# Patient Record
Sex: Female | Born: 1935 | Race: White | Hispanic: No | Marital: Single | Smoking: Never smoker
Health system: Southern US, Community
[De-identification: ages and names within clinical notes are randomized; demographics above are authoritative.]

## PROBLEM LIST (undated history)

## (undated) DIAGNOSIS — K623 Rectal prolapse: Secondary | ICD-10-CM

## (undated) DIAGNOSIS — L219 Seborrheic dermatitis, unspecified: Secondary | ICD-10-CM

## (undated) DIAGNOSIS — R079 Chest pain, unspecified: Secondary | ICD-10-CM

## (undated) DIAGNOSIS — S72009A Fracture of unspecified part of neck of unspecified femur, initial encounter for closed fracture: Secondary | ICD-10-CM

## (undated) DIAGNOSIS — R05 Cough: Secondary | ICD-10-CM

## (undated) DIAGNOSIS — M436 Torticollis: Secondary | ICD-10-CM

## (undated) DIAGNOSIS — M4120 Other idiopathic scoliosis, site unspecified: Secondary | ICD-10-CM

## (undated) DIAGNOSIS — M810 Age-related osteoporosis without current pathological fracture: Secondary | ICD-10-CM

## (undated) DIAGNOSIS — M545 Low back pain: Secondary | ICD-10-CM

## (undated) DIAGNOSIS — M79609 Pain in unspecified limb: Secondary | ICD-10-CM

## (undated) DIAGNOSIS — K648 Other hemorrhoids: Secondary | ICD-10-CM

## (undated) DIAGNOSIS — R609 Edema, unspecified: Secondary | ICD-10-CM

## (undated) DIAGNOSIS — M1991 Primary osteoarthritis, unspecified site: Secondary | ICD-10-CM

## (undated) DIAGNOSIS — M954 Acquired deformity of chest and rib: Secondary | ICD-10-CM

## (undated) DIAGNOSIS — R32 Unspecified urinary incontinence: Secondary | ICD-10-CM

## (undated) HISTORY — DX: Primary osteoarthritis, unspecified site: M19.91

## (undated) HISTORY — PX: APPENDECTOMY: SHX54

## (undated) HISTORY — DX: Age-related osteoporosis without current pathological fracture: M81.0

## (undated) HISTORY — DX: Pain in unspecified limb: M79.609

## (undated) HISTORY — DX: Chest pain, unspecified: R07.9

## (undated) HISTORY — DX: Torticollis: M43.6

## (undated) HISTORY — DX: Unspecified urinary incontinence: R32

## (undated) HISTORY — PX: CATARACT EXTRACTION: SUR2

## (undated) HISTORY — DX: Cough: R05

## (undated) HISTORY — DX: Low back pain: M54.5

## (undated) HISTORY — DX: Rectal prolapse: K62.3

## (undated) HISTORY — DX: Fracture of unspecified part of neck of unspecified femur, initial encounter for closed fracture: S72.009A

## (undated) HISTORY — DX: Other hemorrhoids: K64.8

## (undated) HISTORY — DX: Acquired deformity of chest and rib: M95.4

## (undated) HISTORY — PX: HEMORRHOID SURGERY: SHX153

## (undated) HISTORY — DX: Other idiopathic scoliosis, site unspecified: M41.20

## (undated) HISTORY — DX: Edema, unspecified: R60.9

## (undated) HISTORY — DX: Seborrheic dermatitis, unspecified: L21.9

---

## 1998-07-04 ENCOUNTER — Encounter: Payer: Self-pay | Admitting: Neurosurgery

## 1998-07-04 ENCOUNTER — Ambulatory Visit (HOSPITAL_COMMUNITY): Admission: RE | Admit: 1998-07-04 | Discharge: 1998-07-04 | Payer: Self-pay | Admitting: Neurosurgery

## 1998-08-24 DIAGNOSIS — R059 Cough, unspecified: Secondary | ICD-10-CM

## 1998-08-24 HISTORY — DX: Cough, unspecified: R05.9

## 1998-10-15 HISTORY — PX: COLONOSCOPY: SHX174

## 1999-02-13 DIAGNOSIS — K648 Other hemorrhoids: Secondary | ICD-10-CM

## 1999-02-13 HISTORY — DX: Other hemorrhoids: K64.8

## 2000-02-28 DIAGNOSIS — R32 Unspecified urinary incontinence: Secondary | ICD-10-CM

## 2000-02-28 HISTORY — DX: Unspecified urinary incontinence: R32

## 2000-04-03 ENCOUNTER — Ambulatory Visit (HOSPITAL_COMMUNITY): Admission: RE | Admit: 2000-04-03 | Discharge: 2000-04-03 | Payer: Self-pay | Admitting: *Deleted

## 2001-12-02 DIAGNOSIS — R079 Chest pain, unspecified: Secondary | ICD-10-CM

## 2001-12-02 DIAGNOSIS — M79609 Pain in unspecified limb: Secondary | ICD-10-CM

## 2001-12-02 HISTORY — DX: Chest pain, unspecified: R07.9

## 2001-12-02 HISTORY — DX: Pain in unspecified limb: M79.609

## 2003-08-20 ENCOUNTER — Observation Stay (HOSPITAL_COMMUNITY): Admission: RE | Admit: 2003-08-20 | Discharge: 2003-08-21 | Payer: Self-pay | Admitting: General Surgery

## 2004-10-04 DIAGNOSIS — L219 Seborrheic dermatitis, unspecified: Secondary | ICD-10-CM

## 2004-10-04 DIAGNOSIS — M436 Torticollis: Secondary | ICD-10-CM | POA: Insufficient documentation

## 2004-10-04 HISTORY — DX: Torticollis: M43.6

## 2004-10-04 HISTORY — DX: Seborrheic dermatitis, unspecified: L21.9

## 2005-09-12 ENCOUNTER — Encounter: Admission: RE | Admit: 2005-09-12 | Discharge: 2005-09-12 | Payer: Self-pay | Admitting: Internal Medicine

## 2005-12-13 HISTORY — PX: BOTOX INJECTION: SHX5754

## 2006-03-16 ENCOUNTER — Inpatient Hospital Stay (HOSPITAL_COMMUNITY): Admission: EM | Admit: 2006-03-16 | Discharge: 2006-03-18 | Payer: Self-pay | Admitting: *Deleted

## 2006-03-18 ENCOUNTER — Ambulatory Visit: Payer: Self-pay | Admitting: Physical Medicine & Rehabilitation

## 2006-03-19 DIAGNOSIS — S72009A Fracture of unspecified part of neck of unspecified femur, initial encounter for closed fracture: Secondary | ICD-10-CM

## 2006-03-19 HISTORY — DX: Fracture of unspecified part of neck of unspecified femur, initial encounter for closed fracture: S72.009A

## 2006-03-25 DIAGNOSIS — R609 Edema, unspecified: Secondary | ICD-10-CM

## 2006-03-25 DIAGNOSIS — M810 Age-related osteoporosis without current pathological fracture: Secondary | ICD-10-CM

## 2006-03-25 HISTORY — DX: Age-related osteoporosis without current pathological fracture: M81.0

## 2006-03-25 HISTORY — DX: Edema, unspecified: R60.9

## 2006-03-29 ENCOUNTER — Ambulatory Visit (HOSPITAL_COMMUNITY): Admission: RE | Admit: 2006-03-29 | Discharge: 2006-03-29 | Payer: Self-pay | Admitting: Family Medicine

## 2006-05-14 DIAGNOSIS — M4120 Other idiopathic scoliosis, site unspecified: Secondary | ICD-10-CM

## 2006-05-14 HISTORY — DX: Other idiopathic scoliosis, site unspecified: M41.20

## 2006-06-05 DIAGNOSIS — M1991 Primary osteoarthritis, unspecified site: Secondary | ICD-10-CM

## 2006-06-05 HISTORY — DX: Primary osteoarthritis, unspecified site: M19.91

## 2006-07-10 DIAGNOSIS — K623 Rectal prolapse: Secondary | ICD-10-CM

## 2006-07-10 HISTORY — DX: Rectal prolapse: K62.3

## 2007-02-19 DIAGNOSIS — M545 Low back pain, unspecified: Secondary | ICD-10-CM

## 2007-02-19 HISTORY — DX: Low back pain, unspecified: M54.50

## 2007-05-28 DIAGNOSIS — M954 Acquired deformity of chest and rib: Secondary | ICD-10-CM

## 2007-05-28 HISTORY — DX: Acquired deformity of chest and rib: M95.4

## 2011-12-26 ENCOUNTER — Ambulatory Visit
Admission: RE | Admit: 2011-12-26 | Discharge: 2011-12-26 | Disposition: A | Discharge status: Home / Self Care | Payer: Medicare Other | Source: Ambulatory Visit | Attending: Internal Medicine | Admitting: Internal Medicine

## 2011-12-26 ENCOUNTER — Other Ambulatory Visit: Payer: Self-pay | Admitting: Geriatric Medicine

## 2011-12-26 DIAGNOSIS — R05 Cough: Secondary | ICD-10-CM

## 2012-08-21 ENCOUNTER — Ambulatory Visit
Admission: RE | Admit: 2012-08-21 | Discharge: 2012-08-21 | Disposition: A | Discharge status: Home / Self Care | Payer: Medicare Other | Source: Ambulatory Visit | Attending: Geriatric Medicine | Admitting: Geriatric Medicine

## 2012-08-21 ENCOUNTER — Other Ambulatory Visit: Payer: Self-pay | Admitting: Geriatric Medicine

## 2012-08-21 DIAGNOSIS — M549 Dorsalgia, unspecified: Secondary | ICD-10-CM

## 2012-08-21 DIAGNOSIS — R05 Cough: Secondary | ICD-10-CM

## 2012-08-21 DIAGNOSIS — R609 Edema, unspecified: Secondary | ICD-10-CM

## 2012-08-21 LAB — BASIC METABOLIC PANEL
BUN: 21 mg/dL (ref 4–21)
Creatinine: 0.6 mg/dL (ref 0.5–1.1)
Glucose: 111 mg/dL
POTASSIUM: 4.2 mmol/L (ref 3.4–5.3)
SODIUM: 141 mmol/L (ref 137–147)

## 2012-08-21 LAB — HEPATIC FUNCTION PANEL
ALK PHOS: 49 U/L (ref 25–125)
ALT: 9 U/L (ref 7–35)
AST: 17 U/L (ref 13–35)
Bilirubin, Total: 0.5 mg/dL

## 2012-08-21 LAB — CBC AND DIFFERENTIAL
HEMATOCRIT: 37 % (ref 36–46)
HEMOGLOBIN: 11.6 g/dL — AB (ref 12.0–16.0)
Platelets: 275 10*3/uL (ref 150–399)
WBC: 4.2 10^3/mL

## 2013-10-23 ENCOUNTER — Other Ambulatory Visit: Payer: Self-pay | Admitting: Geriatric Medicine

## 2013-10-23 ENCOUNTER — Encounter: Payer: Self-pay | Admitting: Geriatric Medicine

## 2013-10-23 ENCOUNTER — Non-Acute Institutional Stay: Payer: Medicare Other | Admitting: Geriatric Medicine

## 2013-10-23 ENCOUNTER — Ambulatory Visit
Admission: RE | Admit: 2013-10-23 | Discharge: 2013-10-23 | Disposition: A | Discharge status: Home / Self Care | Payer: Medicare Other | Source: Ambulatory Visit | Attending: Geriatric Medicine | Admitting: Geriatric Medicine

## 2013-10-23 DIAGNOSIS — R918 Other nonspecific abnormal finding of lung field: Secondary | ICD-10-CM

## 2013-10-23 DIAGNOSIS — M436 Torticollis: Secondary | ICD-10-CM

## 2013-10-23 DIAGNOSIS — R609 Edema, unspecified: Secondary | ICD-10-CM

## 2013-10-23 DIAGNOSIS — R05 Cough: Secondary | ICD-10-CM

## 2013-10-23 DIAGNOSIS — R059 Cough, unspecified: Secondary | ICD-10-CM

## 2013-10-23 DIAGNOSIS — M545 Low back pain, unspecified: Secondary | ICD-10-CM

## 2013-10-23 DIAGNOSIS — Z66 Do not resuscitate: Secondary | ICD-10-CM

## 2013-10-23 DIAGNOSIS — R079 Chest pain, unspecified: Secondary | ICD-10-CM

## 2013-10-23 DIAGNOSIS — R222 Localized swelling, mass and lump, trunk: Secondary | ICD-10-CM

## 2013-10-23 NOTE — Assessment & Plan Note (Signed)
New lung mass right perihilar region; no masses present on previous x-rays 2013. Patient's worsening cough, musculoskeletal pain and x-ray findings are worrisome for carcinoma. Discussed proceeding with CT scan of the chest to better diagnose this problem. Patient is agreeable to this investigation. She does continue to maintain she's not interested in prolonging her life though is interested in finding out why she feels so bad.

## 2013-10-23 NOTE — Progress Notes (Signed)
Patient ID: [name], female   DOB: September 01, 1915, 80 y.o.   MRN: [id number]  [facility] [phone])  Code Status: DNR Contact Information   Name Relation Home Work Mobile   [place] [phone]    Stefanie Libel [phone]         Chief Complaint  Patient presents with  . Cough  . Abnormal CXR    HPI: This is a 80 y.o. female resident of [facility]  section.  Evaluation is requested today due to  worsening cough and new onset of pain. Patient reports she's had increase of pain "all over the last 2-3 days. Chronic cough is worse, and occasionally productive. Her chronic back pain is much worse. She is having difficulty today lifting her arms and is experiencing very bad pain getting in and out of her chair and in and out of bed. She denies any headache sore throat or chest pain or shortness of breath. Tells me her appetite remains good, sleeping well despite getting up every 2 hours to urinate. This patient has been using a power wheelchair for several years for any distance mobility. She continues to travel from her apartment to the dining room daily for lunch meal, remains independent in her activities of daily living.    No Known Allergies  MEDICATIONS -  no prescribed medications prior to this visit. Does use cough drops multiple times during the day   Medication List       This list is accurate as of: 10/23/13  2:00 PM.  Always use your most recent med list.               chlorpheniramine-HYDROcodone 10-8 MG/5ML Lqcr  Commonly known as:  TUSSIONEX  Take 2.5 mLs by mouth 2 (two) times daily.        DATA REVIEWED  Radiologic Exams 10/23/2013  CHEST 2 VIEW  COMPARISON: 08/21/2012.  IMPRESSION:  New nodular opacity in the right perihilar region, worrisome for primary bronchogenic carcinoma. CT chest with contrast could be performed in further evaluation, as clinically  indicated.  Cardiovascular Exams:   Laboratory Studies Lab Results- [facility] 08/21/2012  Component Value   WBC 4.2   HGB 11.6*   HCT 37   PLT 275       Glucose 111   ALT 9   AST 17   NA 141   K 4.2   CREATININE 0.6   BUN 21   Albumin 3.6       BNP 123.5    REVIEW OF SYSTEMS  DATA OBTAINED: from patient GENERAL: Feels unwell due to pain  No fevers, fatigue, change in appetite or weight SKIN: No itch, rash or open wounds EYES: No eye pain, dryness or itching  No change in vision EARS: No earache, tinnitus, change in hearing NOSE: No congestion, drainage or bleeding MOUTH/THROAT: No mouth or tooth pain    No sore throat    No difficulty chewing or swallowing (cannot swallow pills, not new) RESPIRATORY: Cough, worse than usual No wheezing, SOB CARDIAC: No chest pain, palpitations  No edema. CHEST/BREASTS: Right-sided anterior chest discomfort  GI: No abdominal pain  No N/V/D or constipation  No heartburn or reflux  GU: No dysuria, frequency or urgency  No change in urine volume or character  Nocturia q2hr (not new)  MUSCULOSKELETAL: Generalized skeletal pain, chronic back pain is worse unable to tip head up to 2 neck pain and stiffness , unable to raise arms  past a certain point. Gait is unsteady, chronic, is able to ambulate short distances No recent falls.  NEUROLOGIC: No dizziness, fainting, headache,  No change in mental status.  PSYCHIATRIC: No feelings of anxiety, depression   Sleeps well.  Marland Kitchen.    PHYSICAL EXAM Filed Vitals:   10/23/13 1348  BP: 130/70  Pulse: 104  Temp: 99.8 F (37.7 C)  SpO2: 92%   There is no height or weight on file to calculate BMI.  GENERAL APPEARANCE: No acute distress, appropriately groomed, Frail, very thin body habitus. Alert, pleasant, conversant. SKIN: No diaphoresis, rash, unusual lesions, wounds HEAD: Normocephalic, atraumatic EYES: Conjunctiva/lids clear. Pupils round, reactive.  EARS: External exam WNL  Hearing grossly  normal. NOSE: No deformity or discharge. MOUTH/THROAT: Lips w/o lesions. Oral mucosa, tongue moist, w/o lesion. Oropharynx w/o redness or lesions.  NECK: Very limited ROM. No thyroid tenderness, enlargement or nodule LYMPHATICS: No head, neck or supraclavicular adenopathy RESPIRATORY: Breathing is even, unlabored. Lung sounds are clear, shallow. Deep breath causes coughing  CHEST/BREASTS: No chest deformity. Anterior right chest tenderness CARDIOVASCULAR: Heart RRR. No murmur or extra heart sounds  VENOUS: No varicosities. No venous stasis skin changes  EDEMA: No peripheral edema.  GASTROINTESTINAL: Abdomen is soft, non-tender, not distended w/ normal bowel sounds. MUSCULOSKELETAL: Bilateral shoulder movement is reduced with active range of motion, limited to about 90. Is able to raise her hands arms over her head with assistance. Neck with mild torticollis to the right. Back with kyphosis, generalized lower back tenderness.  NEUROLOGIC: Oriented to time, place, person. Cranial nerves 2-12 grossly intact, speech clear, no tremor.  PSYCHIATRIC: Mood and affect appropriate to situation  ASSESSMENT/PLAN  Lung mass New lung mass right perihilar region; no masses present on previous x-rays 2013. Patient's worsening cough, musculoskeletal pain and x-ray findings are worrisome for carcinoma. Discussed proceeding with CT scan of the chest to better diagnose this problem. Patient is agreeable to this investigation. She does continue to maintain she's not interested in prolonging her life though is interested in finding out why she feels so bad.   Time: 45 minutes, >50% spent counseling/or care coordination  Follow up: As needed  Alias Villagran T.Jaylie Neaves, NP-C 10/23/2013

## 2013-10-24 ENCOUNTER — Other Ambulatory Visit: Payer: Self-pay | Admitting: Geriatric Medicine

## 2013-10-24 MED ORDER — HYDROCOD POLST-CHLORPHEN POLST 10-8 MG/5ML PO LQCR: 2.5000 mL | Freq: Two times a day (BID) | ORAL | Status: DC

## 2013-10-28 ENCOUNTER — Ambulatory Visit
Admission: RE | Admit: 2013-10-28 | Discharge: 2013-10-28 | Disposition: A | Discharge status: Home / Self Care | Payer: Medicare Other | Source: Ambulatory Visit | Attending: Geriatric Medicine | Admitting: Geriatric Medicine

## 2013-10-28 DIAGNOSIS — R918 Other nonspecific abnormal finding of lung field: Secondary | ICD-10-CM

## 2013-10-29 ENCOUNTER — Other Ambulatory Visit: Payer: Self-pay | Admitting: Geriatric Medicine

## 2013-10-29 NOTE — Addendum Note (Signed)
Addended by: Charna ElizabethWILLIAMS, DEBRA J on: 10/29/2013 10:40 AM   Modules accepted: Orders

## 2013-11-02 ENCOUNTER — Other Ambulatory Visit: Payer: Self-pay | Admitting: Geriatric Medicine

## 2013-11-02 MED ORDER — HYDROCOD POLST-CHLORPHEN POLST 10-8 MG/5ML PO LQCR: 2.5000 mL | Freq: Two times a day (BID) | ORAL | Status: DC

## 2013-11-04 ENCOUNTER — Non-Acute Institutional Stay: Payer: Medicare Other | Admitting: Geriatric Medicine

## 2013-11-04 ENCOUNTER — Encounter: Payer: Self-pay | Admitting: Geriatric Medicine

## 2013-11-04 VITALS — BP 140/68 | HR 73 | Ht 59.5 in | Wt 91.0 lb

## 2013-11-04 DIAGNOSIS — R222 Localized swelling, mass and lump, trunk: Secondary | ICD-10-CM

## 2013-11-04 DIAGNOSIS — R918 Other nonspecific abnormal finding of lung field: Secondary | ICD-10-CM

## 2013-11-04 NOTE — Progress Notes (Signed)
Patient ID: Ewing SchleinMargaret Moran, female   DOB: 1914-11-12, 80 y.o.   MRN: [id number]  [facility] [phone](12)  Code Status: DNR     Contact Information   Name Relation Home Work Mobile   [place] In[place]Berry,M.Jennifer Nephew [phone]    [name],Moran Other [phone]     [name],[name] Other [phone]         Chief Complaint  Patient presents with  . Lung Lesion    right upper lobe, seen on chest x-ray and CT scan. With niece Jennifer Hatchnn today    HPI: This is a 80 y.o. female resident of [facility]  [facility].  She returns to clinic today to discuss results of CT scan of the chest .  Last visit: Lung mass New lung mass right perihilar region; no masses present on previous x-rays 2013. Patient's worsening cough, musculoskeletal pain and x-ray findings are worrisome for carcinoma. Discussed proceeding with CT scan of the chest to better diagnose this problem. Patient is agreeable to this investigation. She does continue to maintain she's not interested in prolonging her life though is interested in finding out why she feels so bad.  Since last visit patient has been taking hydrocodone cough syrup small dose twice a day. This has resulted in less coughing and less pain. Patient reports she still has generalized dull pain especially along her right anterior chest wall, but no longer has sharp pain that is limiting her arm movements. CT of the chest confirmed the right upper lobe lesion, but does not appear to be lymphadenopathy or bony metastasis. Discussed these findings at length today with the patient and her niece [name]. Patient does not desire to investigate this lesion any further, she has no intention of undergoing any treatment. She has demonstrated some physical and mental decline over the last several months, we discussed implications of these changes. This patient has a great desire to remain as independent as possible for  as long as possible. She tells me that she will accept help "when the time comes". Specific recommendations today is for use of a walker during ambulation in her apartment, have asked her wear the emergency pendant or at least on the walker for easy accessibility should she fall.   No Known Allergies     Medication List       This list is accurate as of: 11/04/13  4:39 PM.  Always use your most recent med list.               chlorpheniramine-HYDROcodone 10-8 MG/5ML Lqcr  Commonly known as:  TUSSIONEX  Take 2.5 mLs by mouth 2 (two) times daily.        DATA REVIEWED  Radiologic Exams 10/23/2013  CHEST 2 VIEW  COMPARISON: 08/21/2012.  IMPRESSION:  New nodular opacity in the right perihilar region, worrisome for primary bronchogenic carcinoma. CT chest with contrast could be performed in further evaluation, as clinically indicated.  10/28/2013  CT CHEST WITHOUT CONTRAST   COMPARISON: Chest x-ray 06/02/2014.  IMPRESSION:  1. 24 x 20 mm superior segment right upper lobe pulmonary lesion with adjacent postobstructive atelectasis. This is worrisome for pulmonary neoplasm. Recommend PET-CT for further evaluation. No  obvious mediastinal or hilar lymphadenopathy.  2. Small scattered sub 3 mm pulmonary nodules.  3. No findings for osseous metastatic disease.   Cardiovascular Exams:   Laboratory Studies Lab Results- [facility] 08/21/2012  Component Value   WBC 4.2   HGB 11.6*   HCT 37  PLT 275       Glucose 111   ALT 9   AST 17   NA 141   K 4.2   CREATININE 0.6   BUN 21   Albumin 3.6       BNP 123.5    REVIEW OF SYSTEMS  DATA OBTAINED: from patient GENERAL: Feels better than last visit, tires easily, No fevers, change in appetite or weight NOSE: No congestion, drainage or bleeding MOUTH/THROAT: No mouth or tooth pain    No sore throat    No difficulty chewing or swallowing (cannot swallow pills, not new) RESPIRATORY: Cough, less prominent, nonproductive No wheezing,  SOB CARDIAC: No chest pain, palpitations  No edema. CHEST/BREASTS: Right-sided anterior chest discomfort, dull pain GI: No abdominal pain  No N/V/D or constipation  No heartburn or reflux  GU: No dysuria, frequency or urgency  No change in urine volume or character  Nocturia q2hr (not new)  MUSCULOSKELETAL: Less generalized skeletal pain, chronic back pain is tolerable.  Gait is unsteady, chronic, is able to ambulate short distances No recent falls.  NEUROLOGIC: No dizziness, fainting, headache,  No change in mental status.  PSYCHIATRIC: No feelings of anxiety, depression   Sleeps well.  Marland Kitchen    PHYSICAL EXAM Filed Vitals:   11/04/13 1634  BP: 140/68  Pulse: 73  Height: 4' 11.5" (1.511 m)  Weight: 91 lb (41.277 kg)  SpO2: 94%   Body mass index is 18.08 kg/(m^2).  GENERAL APPEARANCE: No acute distress, appropriately groomed, Frail, very thin body habitus. Alert, pleasant, conversant. SKIN: No diaphoresis, rash, unusual lesions, wounds HEAD: Normocephalic, atraumatic EYES: Conjunctiva/lids clear. Pupils round, reactive.  EARS: External exam WNL  Hearing grossly normal. NOSE: No deformity or discharge. MOUTH/THROAT: Lips w/o lesions. Oral mucosa, tongue moist, w/o lesion. Oropharynx w/o redness or lesions.  NECK: Very limited ROM. No thyroid tenderness, enlargement or nodule LYMPHATICS: No head, neck or supraclavicular adenopathy RESPIRATORY: Breathing is even, unlabored. Lung sounds are clear, shallow. Deep breath causes coughing  CHEST/BREASTS: No chest deformity. Anterior right chest tenderness CARDIOVASCULAR: Heart RRR. No murmur or extra heart sounds  VENOUS: No varicosities. No venous stasis skin changes  EDEMA: No peripheral edema.  GASTROINTESTINAL: Abdomen is soft, non-tender, not distended w/ normal bowel sounds. MUSCULOSKELETAL:  Is able to raise both arms above her head today Neck with mild torticollis to the right. Back with kyphosis, generalized lower back tenderness.   NEUROLOGIC: Oriented to time, place, person. Cranial nerves 2-12 grossly intact, speech clear, no tremor.  PSYCHIATRIC: Mood and affect appropriate to situation  ASSESSMENT/PLAN  Lung mass CT scan confirmed left upper lobe lung mass. Appears peripheral, close to anterior right chest wall. This likely accounts for her chest wall discomfort. Hydrocodone syrup has been effective in managing her main symptoms of cough and pain. Anticipate patient will have a slow decline from this process along with her advancing age. Have discussed some strategies for maintaining safe environment her home, and reminded her that there are resources and support at [facility] available to her as her condition changes.   Time: 45 minutes, >50% spent counseling/or care coordination  Follow up: One month  Jamal Pavon T.Orris Perin, NP-C 11/04/2013

## 2013-11-04 NOTE — Assessment & Plan Note (Signed)
CT scan confirmed left upper lobe lung mass. Appears peripheral, close to anterior right chest wall. This likely accounts for her chest wall discomfort. Hydrocodone syrup has been effective in managing her main symptoms of cough and pain. Anticipate patient will have a slow decline from this process along with her advancing age. Have discussed some strategies for maintaining safe environment her home, and reminded her that there are resources and support at [facility] available to her as her condition changes.

## 2013-11-05 ENCOUNTER — Other Ambulatory Visit: Payer: Medicare Other

## 2013-12-09 ENCOUNTER — Encounter: Payer: Self-pay | Admitting: Geriatric Medicine

## 2013-12-09 ENCOUNTER — Non-Acute Institutional Stay: Payer: Medicare Other | Admitting: Geriatric Medicine

## 2013-12-09 VITALS — BP 148/68 | HR 84 | Ht 59.5 in | Wt 87.0 lb

## 2013-12-09 DIAGNOSIS — R222 Localized swelling, mass and lump, trunk: Secondary | ICD-10-CM

## 2013-12-09 DIAGNOSIS — R918 Other nonspecific abnormal finding of lung field: Secondary | ICD-10-CM

## 2013-12-09 NOTE — Assessment & Plan Note (Signed)
Symptoms of anterior chest pain and cough are improved. Patient declines refill of hydrocodone cough syrup. Tells me she'll call if she needs it. Patient does exhibit significant weight loss in the last month despite maintaining her usual p.o. Intake. Patient continues to be very pragmatic about her situation, notes that "I am 80 years old". She is very safe and comfortable in her home, content with her daily activities, expresses that she is ready to die when it is her time. Encouraged her to call with any changes in her condition including any return of pain, worsening cough, nausea, loss of appetite or fatigue.

## 2013-12-09 NOTE — Progress Notes (Signed)
Patient ID: [name], female   DOB: 1915-09-24, 80 y.o.   MRN: [id number]  [facility] [phone](12)  Code Status: DNR     Contact Information   Name Relation Home Work Mobile   [place],M.Douglas Nephew [phone]    Stefanie LibelSomers,[name] Other [phone]     Lonia FarberBerry,Rosemary Other [phone]         Chief Complaint  Patient presents with  . Medical Managment of Chronic Issues    lung mass    HPI: This is a 80 y.o. female resident of [facility]  [facility].   Last visit:  Lung mass CT scan confirmed left upper lobe lung mass. Appears peripheral, close to anterior right chest wall. This likely accounts for her chest wall discomfort. Hydrocodone syrup has been effective in managing her main symptoms of cough and pain. Anticipate patient will have a slow decline from this process along with her advancing age. Have discussed some strategies for maintaining safe environment her home, and reminded her that there are resources and support at [facility] available to her as her condition changes.  Since last visit, patient reports anterior chest pain has resolved, her cough has improved as well. She is no longer taking hydrocodone cough syrup. Discontinue with her chronic cough she treats with cough drops. 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 Turns off the computer at 5 PM watches the news has a martini and goes to bed. Patient remarks she is quite satisfied with her life though she is ready to go anytime.   No Known Allergies     Medication List    Notice As of 12/09/2013  4:14 PM   You have not been prescribed any medications.      DATA REVIEWED  Radiologic Exams 10/23/2013  CHEST 2 VIEW  COMPARISON: 08/21/2012.  IMPRESSION:  New nodular opacity in the right  perihilar region, worrisome for primary bronchogenic carcinoma. CT chest with contrast could be performed in further evaluation, as clinically indicated.  10/28/2013  CT CHEST WITHOUT CONTRAST   COMPARISON: Chest x-ray 06/02/2014.  IMPRESSION:  1. 24 x 20 mm superior segment right upper lobe pulmonary lesion with adjacent postobstructive atelectasis. This is worrisome for pulmonary neoplasm. Recommend PET-CT for further evaluation. No  obvious mediastinal or hilar lymphadenopathy.  2. Small scattered sub 3 mm pulmonary nodules.  3. No findings for osseous metastatic disease.   Cardiovascular Exams:   Laboratory Studies Lab Results- [facility] 08/21/2012  Component Value   WBC 4.2   HGB 11.6*   HCT 37   PLT 275       Glucose 111   ALT 9   AST 17   NA 141   K 4.2   CREATININE 0.6   BUN 21   Albumin 3.6       BNP 123.5    REVIEW OF SYSTEMS  DATA OBTAINED: from patient GENERAL: Feels better than last visit, has resumed her usual activity, No fevers or change in appetite. Has lost weight NOSE: No congestion, drainage or bleeding MOUTH/THROAT: No mouth or tooth pain    No sore throat    No difficulty chewing or swallowing (cannot swallow pills, not new) RESPIRATORY: Cough, less prominent, nonproductive No wheezing, SOB CARDIAC: No chest pain, palpitations  No edema. GI: No abdominal pain  No N/V/D or constipation  No  heartburn or reflux  GU: No dysuria, frequency or urgency  No change in urine volume or character  Nocturia q2hr (not new)  MUSCULOSKELETAL: Chronic back pain is tolerable, limits activity.  Gait is unsteady, chronic, is able to ambulate short distances  No recent falls.  NEUROLOGIC: No dizziness, fainting, headache,  No change in mental status.  PSYCHIATRIC: No feelings of anxiety, depression   Sleeps well.  Marland Kitchen    PHYSICAL EXAM Filed Vitals:   12/09/13 1553  BP: 148/68  Pulse: 84  Height: 4' 11.5" (1.511 m)  Weight: 87 lb (39.463 kg)   Body mass index is 17.28  kg/(m^2).  GENERAL APPEARANCE: No acute distress, appropriately groomed, Frail, very thin body habitus. 7 lb weight loss in last month. Alert, pleasant, conversant. SKIN: No diaphoresis, rash, HEAD: Normocephalic, atraumatic EYES: Conjunctiva/lids clear. Pupils round, reactive.  EARS: External exam WNL  Hearing grossly normal. NOSE: No deformity or discharge. MOUTH/THROAT: Lips w/o lesions. Oral mucosa, tongue moist, w/o lesion. Oropharynx w/o redness or lesions.  NECK: Very limited ROM. No thyroid tenderness, enlargement or nodule LYMPHATICS: No head, neck or supraclavicular adenopathy RESPIRATORY: Breathing is even, unlabored. Lung sounds are clear, shallow. Able to take deep breath without coughing   CHEST/BREASTS: No chest deformity. No Anterior right chest tenderness CARDIOVASCULAR: Heart RRR. No murmur or extra heart sounds  VENOUS: No varicosities. No venous stasis skin changes  EDEMA: No peripheral edema.  GASTROINTESTINAL: Abdomen is soft, non-tender, not distended w/ normal bowel sounds. MUSCULOSKELETAL:  Is able to raise both arms above her head today Neck with mild torticollis to the right. Back with kyphosis, generalized lower back tenderness.  NEUROLOGIC: Oriented to time, place, person. Cranial nerves 2-12 grossly intact, speech clear, no tremor.  PSYCHIATRIC: Mood and affect appropriate to situation  ASSESSMENT/PLAN  Lung mass Symptoms of anterior chest pain and cough are improved. Patient declines refill of hydrocodone cough syrup. Tells me she'll call if she needs it. Patient does exhibit significant weight loss in the last month despite maintaining her usual p.o. Intake. Patient continues to be very pragmatic about her situation, notes that "I am 80 years old". She is very safe and comfortable in her home, content with her daily activities, expresses that she is ready to die when it is her time. Encouraged her to call with any changes in her condition including any return of  pain, worsening cough, nausea, loss of appetite or fatigue.    Follow up: Return in about 2 months (around 02/06/2014) for Lung  mass, weight loss.  Toribio Harbour, NP-C [facility] [phone]  12/09/2013

## 2014-02-03 ENCOUNTER — Encounter: Payer: Self-pay | Admitting: Geriatric Medicine

## 2014-02-03 ENCOUNTER — Non-Acute Institutional Stay: Payer: Medicare Other | Admitting: Geriatric Medicine

## 2014-02-03 VITALS — BP 126/66 | HR 72 | Wt 87.0 lb

## 2014-02-03 DIAGNOSIS — M545 Low back pain, unspecified: Secondary | ICD-10-CM

## 2014-02-03 DIAGNOSIS — R918 Other nonspecific abnormal finding of lung field: Secondary | ICD-10-CM

## 2014-02-03 DIAGNOSIS — R222 Localized swelling, mass and lump, trunk: Secondary | ICD-10-CM

## 2014-02-03 NOTE — Assessment & Plan Note (Addendum)
Severe back pain does limit mobility though she is able to ambulate short distances without assistive device. Uses power scooter for any mobility outside her apartment.  Reviewed some safety considerations relating to her home. Reminded her that if she feels at all unsteady use of a walker would be safest alternative as opposed to "furniture walking".  Declines use of any medication to reduce pain

## 2014-02-03 NOTE — Assessment & Plan Note (Signed)
No anterior chest pain, productive cough persists. She feels this is managed well enough with Robitussin. No further weight loss in the last 2 months, she continues to have a good appetite. Patient again reminds me that she is nearly 80 years old, intends to stay as independent as possible for as long as possible. Encouraged her to call with any changes in her condition including any return of pain, worsening cough, nausea, loss of appetite or fatigue.

## 2014-02-03 NOTE — Progress Notes (Signed)
Patient ID: Jennifer SchleinMargaret Vankleeck, female   DOB: Oct 06, 1915, 80 y.o.   MRN: [id number]  [facility] [phone](12)  Code Status: DNR     Contact Information   Name Relation Home Work Mobile   LakeportBerry,M.Douglas Nephew [phone]    Stefanie LibelSomers,[name] Other [phone]     Lonia FarberBerry,Rosemary Other [phone]         Chief Complaint  Patient presents with  . Medical Management of Chronic Issues    lung mass    HPI: This is a 80 y.o. female resident of [facility]  section.   Last visit: Lung mass Symptoms of anterior chest pain and cough are improved. Patient declines refill of hydrocodone cough syrup. Tells me she'll call if she needs it. Patient does exhibit significant weight loss in the last month despite maintaining her usual p.o. Intake. Patient continues to be very pragmatic about her situation, notes that "I am 80 years old". She is very safe and comfortable in her home, content with her daily activities, expresses that she is ready to die when it is her time. Encouraged her to call with any changes in her condition including any return of pain, worsening cough, nausea, loss of appetite or fatigue.  Since last visit patient reports feeling "fairly well". Continues with productive cough, uses Robitussin a couple times a day. Denies any chest pain or shortness of breath. Does have significant back and leg pain which limits her mobility. She does ambulate in her apartment without any assistive device. She has not had any falls   No Known Allergies     Medication List    Notice As of 02/03/2014  1:46 PM   You have not been prescribed any medications.      DATA REVIEWED  Radiologic Exams 10/23/2013  CHEST 2 VIEW  COMPARISON: 08/21/2012.  IMPRESSION:  New nodular opacity in the right perihilar region, worrisome for primary bronchogenic carcinoma. CT chest with contrast could be performed in further evaluation, as  clinically indicated.  10/28/2013  CT CHEST WITHOUT CONTRAST   COMPARISON: Chest x-ray 06/02/2014.  IMPRESSION:  1. 24 x 20 mm superior segment right upper lobe pulmonary lesion with adjacent postobstructive atelectasis. This is worrisome for pulmonary neoplasm. Recommend PET-CT for further evaluation. No  obvious mediastinal or hilar lymphadenopathy.  2. Small scattered sub 3 mm pulmonary nodules.  3. No findings for osseous metastatic disease.   Cardiovascular Exams:   Laboratory Studies Lab Results- [facility] 08/21/2012  Component Value   WBC 4.2   HGB 11.6*   HCT 37   PLT 275       Glucose 111   ALT 9   AST 17   NA 141   K 4.2   CREATININE 0.6   BUN 21   Albumin 3.6       BNP 123.5    REVIEW OF SYSTEMS  DATA OBTAINED: from patient GENERAL: Feels well  No recent fever, fatigue, change in activity status, appetite, or weight  NOSE: No congestion, drainage or bleeding MOUTH/THROAT: No mouth or tooth pain    No sore throat    No difficulty chewing or swallowing (cannot swallow pills, not new) RESPIRATORY: Productive cough No wheezing, SOB CARDIAC: No chest pain, palpitations  No edema. GI: No abdominal pain  No N/V/D or constipation  No heartburn or reflux  GU: No dysuria, frequency or urgency  No change in urine volume or character  Nocturia q2hr (not new)  MUSCULOSKELETAL: Chronic back pain  is significant, declines medication, limits activity.  Gait is unsteady, chronic, is able to ambulate short distances  No recent falls.  NEUROLOGIC: No dizziness, fainting, headache,  No change in mental status.  PSYCHIATRIC: No feelings of anxiety, depression   Sleeps well.  Marland Kitchen.    PHYSICAL EXAM Filed Vitals:   02/03/14 1534  BP: 126/66  Pulse: 72  Weight: 87 lb (39.463 kg)   Body mass index is 17.28 kg/(m^2).  GENERAL APPEARANCE: No acute distress, appropriately groomed, Frail, very thin body habitus. No weight loss since last visit  Alert, pleasant, conversant. SKIN: No  diaphoresis, rash, HEAD: Normocephalic, atraumatic EYES: Conjunctiva clear. Right periorbital area with some puffiness Pupils round, reactive.  EARS: External exam WNL  Hearing grossly normal. NOSE: No deformity or discharge. MOUTH/THROAT: Lips w/o lesions. Oral mucosa, tongue moist, w/o lesion. Oropharynx w/o redness or lesions.  NECK: Very limited ROM. No thyroid tenderness, enlargement or nodule LYMPHATICS: No head, neck or supraclavicular adenopathy RESPIRATORY: Breathing is even, unlabored.  Cough present with deep breath, mild upper airway congestion evident, rhonchi present on the right, clears somewhat with coughing  CARDIOVASCULAR: Heart RRR. No murmur or extra heart sounds   EDEMA: No peripheral edema.  GASTROINTESTINAL: Abdomen is soft, non-tender, not distended w/ normal bowel sounds.  MUSCULOSKELETAL:  Is able to raise both arms above her head today Neck with mild torticollis to the right. Back with kyphosis, generalized lower back tenderness.  NEUROLOGIC: Oriented to time, place, person. Speech clear, no tremor.  PSYCHIATRIC: Mood and affect appropriate to situation  ASSESSMENT/PLAN  Lung mass No anterior chest pain, productive cough persists. She feels this is managed well enough with Robitussin. No further weight loss in the last 2 months, she continues to have a good appetite. Patient again reminds me that she is nearly 80 years old, intends to stay as independent as possible for as long as possible. Encouraged her to call with any changes in her condition including any return of pain, worsening cough, nausea, loss of appetite or fatigue.   Lumbago Severe back pain does limit mobility though she is able to ambulate short distances without assistive device. Uses power scooter for any mobility outside her apartment.  Reviewed some safety considerations relating to her home. Reminded her that if she feels at all unsteady use of a walker would be safest alternative as opposed to  "furniture walking".  Declines use of any medication to reduce pain    Follow up: Return in about 3 months (around 05/05/2014) for Lung mass.  Toribio Harbourlaudette T.Ventura Leggitt, NP-C [facility] [phone]  02/03/2014

## 2014-05-05 ENCOUNTER — Encounter: Payer: Self-pay | Admitting: Geriatric Medicine

## 2014-05-05 ENCOUNTER — Encounter: Payer: Self-pay | Admitting: Nurse Practitioner

## 2014-11-30 ENCOUNTER — Emergency Department (HOSPITAL_COMMUNITY)
Admission: EM | Admit: 2014-11-30 | Discharge: 2014-11-30 | Disposition: A | Discharge status: Home / Self Care | Payer: Medicare Other | Attending: Emergency Medicine | Admitting: Emergency Medicine

## 2014-11-30 ENCOUNTER — Encounter (HOSPITAL_COMMUNITY): Payer: Self-pay | Admitting: Emergency Medicine

## 2014-11-30 ENCOUNTER — Emergency Department (HOSPITAL_COMMUNITY): Payer: Medicare Other

## 2014-11-30 DIAGNOSIS — Z8781 Personal history of (healed) traumatic fracture: Secondary | ICD-10-CM | POA: Insufficient documentation

## 2014-11-30 DIAGNOSIS — Z8719 Personal history of other diseases of the digestive system: Secondary | ICD-10-CM | POA: Diagnosis not present

## 2014-11-30 DIAGNOSIS — Z8739 Personal history of other diseases of the musculoskeletal system and connective tissue: Secondary | ICD-10-CM | POA: Insufficient documentation

## 2014-11-30 DIAGNOSIS — Z872 Personal history of diseases of the skin and subcutaneous tissue: Secondary | ICD-10-CM | POA: Diagnosis not present

## 2014-11-30 DIAGNOSIS — R109 Unspecified abdominal pain: Secondary | ICD-10-CM | POA: Insufficient documentation

## 2014-11-30 DIAGNOSIS — Z9049 Acquired absence of other specified parts of digestive tract: Secondary | ICD-10-CM | POA: Diagnosis not present

## 2014-11-30 DIAGNOSIS — R140 Abdominal distension (gaseous): Secondary | ICD-10-CM | POA: Insufficient documentation

## 2014-11-30 DIAGNOSIS — R1030 Lower abdominal pain, unspecified: Secondary | ICD-10-CM | POA: Diagnosis present

## 2014-11-30 LAB — COMPREHENSIVE METABOLIC PANEL
ALBUMIN: 3.6 g/dL (ref 3.5–5.2)
ALT: 13 U/L (ref 0–35)
AST: 18 U/L (ref 0–37)
Alkaline Phosphatase: 41 U/L (ref 39–117)
Anion gap: 7 (ref 5–15)
BILIRUBIN TOTAL: 0.6 mg/dL (ref 0.3–1.2)
BUN: 26 mg/dL — AB (ref 6–23)
CALCIUM: 8.7 mg/dL (ref 8.4–10.5)
CHLORIDE: 102 mmol/L (ref 96–112)
CO2: 31 mmol/L (ref 19–32)
CREATININE: 0.54 mg/dL (ref 0.50–1.10)
GFR calc Af Amer: 89 mL/min — ABNORMAL LOW (ref >90)
GFR calc non Af Amer: 77 mL/min — ABNORMAL LOW (ref >90)
Glucose, Bld: 113 mg/dL — ABNORMAL HIGH (ref 70–99)
Potassium: 4 mmol/L (ref 3.5–5.1)
Sodium: 140 mmol/L (ref 135–145)
TOTAL PROTEIN: 6.5 g/dL (ref 6.0–8.3)

## 2014-11-30 LAB — CBC WITH DIFFERENTIAL/PLATELET
BASOS ABS: 0 10*3/uL (ref 0.0–0.1)
Basophils Relative: 0 % (ref 0–1)
Eosinophils Absolute: 0 10*3/uL (ref 0.0–0.7)
Eosinophils Relative: 0 % (ref 0–5)
HCT: 38.7 % (ref 36.0–46.0)
HEMOGLOBIN: 12.3 g/dL (ref 12.0–15.0)
LYMPHS ABS: 0.8 10*3/uL (ref 0.7–4.0)
Lymphocytes Relative: 10 % — ABNORMAL LOW (ref 12–46)
MCH: 33.2 pg (ref 26.0–34.0)
MCHC: 31.8 g/dL (ref 30.0–36.0)
MCV: 104.3 fL — ABNORMAL HIGH (ref 78.0–100.0)
Monocytes Absolute: 0.5 10*3/uL (ref 0.1–1.0)
Monocytes Relative: 6 % (ref 3–12)
NEUTROS PCT: 84 % — AB (ref 43–77)
Neutro Abs: 6.4 10*3/uL (ref 1.7–7.7)
Platelets: 228 10*3/uL (ref 150–400)
RBC: 3.71 MIL/uL — ABNORMAL LOW (ref 3.87–5.11)
RDW: 13 % (ref 11.5–15.5)
WBC: 7.7 10*3/uL (ref 4.0–10.5)

## 2014-11-30 LAB — URINALYSIS, ROUTINE W REFLEX MICROSCOPIC
Bilirubin Urine: NEGATIVE
Glucose, UA: NEGATIVE mg/dL
Hgb urine dipstick: NEGATIVE
Ketones, ur: NEGATIVE mg/dL
Nitrite: NEGATIVE
PH: 7 (ref 5.0–8.0)
Protein, ur: NEGATIVE mg/dL
SPECIFIC GRAVITY, URINE: 1.026 (ref 1.005–1.030)
Urobilinogen, UA: 0.2 mg/dL (ref 0.0–1.0)

## 2014-11-30 LAB — URINE MICROSCOPIC-ADD ON

## 2014-11-30 LAB — I-STAT CG4 LACTIC ACID, ED: Lactic Acid, Venous: 0.59 mmol/L (ref 0.5–2.0)

## 2014-11-30 LAB — LIPASE, BLOOD: Lipase: 31 U/L (ref 11–59)

## 2014-11-30 MED ORDER — SODIUM CHLORIDE 0.9 % IV SOLN
INTRAVENOUS | Status: DC
  Administered 2014-11-30: 20:00:00 via INTRAVENOUS

## 2014-11-30 MED ORDER — IOHEXOL 300 MG/ML  SOLN
50.0000 mL | Freq: Once | INTRAMUSCULAR | Status: AC | PRN
  Administered 2014-11-30: 50 mL via ORAL

## 2014-11-30 MED ORDER — IOHEXOL 300 MG/ML  SOLN
100.0000 mL | Freq: Once | INTRAMUSCULAR | Status: AC | PRN
  Administered 2014-11-30: 100 mL via INTRAVENOUS

## 2014-11-30 NOTE — Discharge Instructions (Signed)

## 2014-11-30 NOTE — ED Provider Notes (Signed)
CSN: [id number]     Arrival date & time 11/30/14  1924 History   First MD Initiated Contact with Patient 11/30/14 1926     Chief Complaint  Patient presents with  . lower abdominal pain      (Consider location/radiation/quality/duration/timing/severity/associated sxs/prior Treatment) HPI Comments: Patient here complaining of lower abdominal pain which begins afternoon. Denies any associated fever, vomiting, diarrhea. Last bowel movement was today was noted to be nonbloody without evidence of hard stools. She has had similar symptoms in the past has never been seen for this. She is status post appendectomy. Pain characterized as dull and nothing makes it better worse. Denies any urinary symptoms. She is currently pain-free and at her baseline.  The history is provided by the patient.    Past Medical History  Diagnosis Date  . Acquired deformity of chest and rib 05/28/2007  . Lumbago 02/19/2007  . Rectal prolapse 07/10/2006  . Primary localized osteoarthrosis, unspecified site 06/05/2006  . Scoliosis (and kyphoscoliosis), idiopathic 05/14/2006  . Osteoporosis, unspecified 03/25/2006  . Edema 03/25/2006  . Fracture of neck of femur 03/19/2006  . Seborrhea 10/04/2004  . Torticollis, unspecified 10/04/2004  . Pain in limb 12/02/2001  . Chest pain, unspecified 12/02/2001  . Unspecified urinary incontinence 02/28/2000  . Internal hemorrhoids without mention of complication 02/13/1999  . Cough 08/24/1998   Past Surgical History  Procedure Laterality Date  . Colonoscopy  2000  . Botox injection  12/2005    right neck torticollis/cervical dystonia  . Appendectomy    . Hemorrhoid surgery    . Cataract extraction     Family History  Problem Relation Age of Onset  . Stroke Mother    History  Substance Use Topics  . Smoking status: Never Smoker   . Smokeless tobacco: Not on file  . Alcohol Use: No   OB History    No data available     Review of Systems  All other systems reviewed and are  negative.     Allergies  Review of patient's allergies indicates no known allergies.  Home Medications   Prior to Admission medications   Not on File   BP 147/74 mmHg  Pulse 69  Temp(Src) 98.7 F (37.1 C) (Oral)  Resp 16  SpO2 100% Physical Exam  Constitutional: She is oriented to person, place, and time. She appears well-developed and well-nourished.  Non-toxic appearance. No distress.  HENT:  Head: Normocephalic and atraumatic.  Eyes: Conjunctivae, EOM and lids are normal. Pupils are equal, round, and reactive to light.  Neck: Normal range of motion. Neck supple. No tracheal deviation present. No thyroid mass present.  Cardiovascular: Normal rate, regular rhythm and normal heart sounds.  Exam reveals no gallop.   No murmur heard. Pulmonary/Chest: Effort normal and breath sounds normal. No stridor. No respiratory distress. She has no decreased breath sounds. She has no wheezes. She has no rhonchi. She has no rales.  Abdominal: Soft. Normal appearance and bowel sounds are normal. She exhibits distension. There is no tenderness. There is no rigidity, no rebound, no guarding and no CVA tenderness.  Musculoskeletal: Normal range of motion. She exhibits no edema or tenderness.  Neurological: She is alert and oriented to person, place, and time. She has normal strength. No cranial nerve deficit or sensory deficit. GCS eye subscore is 4. GCS verbal subscore is 5. GCS motor subscore is 6.  Skin: Skin is warm and dry. No abrasion and no rash noted.  Psychiatric: She has a normal mood and  affect. Her speech is normal and behavior is normal.  Nursing note and vitals reviewed.   ED Course  Procedures (including critical care time) Labs Review Labs Reviewed  URINE CULTURE  CBC WITH DIFFERENTIAL/PLATELET  COMPREHENSIVE METABOLIC PANEL  LIPASE, BLOOD  URINALYSIS, ROUTINE W REFLEX MICROSCOPIC  I-STAT CG4 LACTIC ACID, ED    Imaging Review No results found.   EKG  Interpretation None      MDM   Final diagnoses:  Abdominal pain     Patient currently does not have any abdominal pain. Laboratory studies and x-rays are reassuring here and patient is stable for discharge    Toy Baker, MD 11/30/14 2249

## 2014-11-30 NOTE — ED Notes (Signed)
Bed: ZO10WA04 Expected date:  Expected time:  Means of arrival:  Comments: EMS- 81 yo M abdominal distention

## 2014-11-30 NOTE — ED Notes (Signed)
Pt refusing all testing except xray at this time. MD made aware

## 2014-11-30 NOTE — ED Notes (Signed)
Pt is aware of the need for urine sample, however states she is unable to provide one at this time.

## 2014-11-30 NOTE — ED Notes (Signed)
Patient transported to CT 

## 2014-11-30 NOTE — ED Notes (Addendum)
Per ems pt from [facility]. Per ems pt co lower abdominal pain started at 4 pm today. No medical Hx per ems. Pt alert and oriented x4. Pt denies N/V. Last BM today. Pt ambulatory with walker. Pt is DNR

## 2014-12-01 ENCOUNTER — Telehealth: Payer: Self-pay

## 2014-12-01 NOTE — Telephone Encounter (Signed)
Spoke with patient about her ER visit 11/30/14, needs follow-up appt. Appt made with Abbey ChattersJessica Eubanks, [facility] 12/08/14 at 1:30.

## 2014-12-02 LAB — URINE CULTURE: Colony Count: >=100000

## 2014-12-08 ENCOUNTER — Non-Acute Institutional Stay: Payer: Medicare Other | Admitting: Nurse Practitioner

## 2014-12-08 ENCOUNTER — Encounter: Payer: Self-pay | Admitting: Nurse Practitioner

## 2014-12-08 VITALS — BP 110/58 | HR 80 | Temp 98.0°F | Wt 88.0 lb

## 2014-12-08 DIAGNOSIS — R1084 Generalized abdominal pain: Secondary | ICD-10-CM | POA: Diagnosis not present

## 2014-12-08 DIAGNOSIS — R05 Cough: Secondary | ICD-10-CM | POA: Diagnosis not present

## 2014-12-08 DIAGNOSIS — R059 Cough, unspecified: Secondary | ICD-10-CM

## 2014-12-08 DIAGNOSIS — R918 Other nonspecific abnormal finding of lung field: Secondary | ICD-10-CM

## 2014-12-08 NOTE — Progress Notes (Signed)
Patient ID: Jennifer SchleinMargaret Moran, female   DOB: 01/26/1915, 81 y.o.   MRN: [id number]    [facility]   Place of Service: Clinic (12)  PCP: REED, Elmarie ShileyIFFANY, DO  No Known Allergies  Chief Complaint  Patient presents with  . Hospitalization Follow-up    ER visit 11-30-14 for abdominal pain - none since    HPI:  Patient is a 81 y.o. female [facility]. Pt with a pmh of lung mass (does not want treatment). Pt was recently seen in the ED due to abdominal pain. Abdominal pain was very sudden and went away on its own after ~2 hours. Extensive workup was done and nothing was found. No further abdominal pain.  Constant cough- reports she is addicted to cough drops. Has coughing spells that will be bad but eventually goes away. No shortness of breath.  Reports she has no assistance in the home. Showers by herself and does her own chores, that's how she stays moving.  She has 100s of nieces and nephews that help her out by running errands and going to the store for her.   Reports great appetite. Goes to the dinning room for lunch, prepares breakfast and supper in her apartment.  Review of Systems:  Review of Systems  Constitutional: Negative for activity change, appetite change and unexpected weight change.  HENT: Negative for congestion.   Eyes: Negative.   Respiratory: Positive for cough. Negative for shortness of breath.   Cardiovascular: Negative for chest pain, palpitations and leg swelling.  Gastrointestinal: Negative for abdominal pain, diarrhea and constipation.  Genitourinary: Negative for dysuria and difficulty urinating.  Musculoskeletal: Negative for myalgias and arthralgias.  Skin: Negative for color change and wound.  Neurological: Negative for dizziness and weakness.  Psychiatric/Behavioral: Negative for behavioral problems, confusion and agitation.    Past Medical History  Diagnosis  Date  . Acquired deformity of chest and rib 05/28/2007  . Lumbago 02/19/2007  . Rectal prolapse 07/10/2006  . Primary localized osteoarthrosis, unspecified site 06/05/2006  . Scoliosis (and kyphoscoliosis), idiopathic 05/14/2006  . Osteoporosis, unspecified 03/25/2006  . Edema 03/25/2006  . Fracture of neck of femur 03/19/2006  . Seborrhea 10/04/2004  . Torticollis, unspecified 10/04/2004  . Pain in limb 12/02/2001  . Chest pain, unspecified 12/02/2001  . Unspecified urinary incontinence 02/28/2000  . Internal hemorrhoids without mention of complication 02/13/1999  . Cough 08/24/1998   Past Surgical History  Procedure Laterality Date  . Colonoscopy  2000  . Botox injection  12/2005    right neck torticollis/cervical dystonia  . Appendectomy    . Hemorrhoid surgery    . Cataract extraction     Social History:   reports that she has never smoked. She does not have any smokeless tobacco history on file. She reports that she does not drink alcohol or use illicit drugs.  Family History  Problem Relation Age of Onset  . Stroke Mother     Medications: Patient's Medications  New Prescriptions   No medications on file  Previous Medications   THROAT LOZENGES (COUGH DROPS MENTHOL MT)    Use as directed 1 lozenge in the mouth or throat every 2 (two) hours as needed (sore throat).  Modified Medications   No medications on file  Discontinued Medications   No medications on file     Physical Exam: Filed Vitals:   12/08/14 1344  BP: 110/58  Pulse: 80  Temp: 98 F (36.7  C)  TempSrc: Oral  Weight: 88 lb (39.917 kg)  SpO2: 94%    Physical Exam  Constitutional: She is oriented to person, place, and time. She appears well-nourished. No distress.  Thin frail female  HENT:  Head: Normocephalic and atraumatic.  Neck: Normal range of motion. Neck supple.  Cardiovascular: Normal rate, regular rhythm and normal heart sounds.   Pulmonary/Chest: Effort normal. She has decreased breath sounds.    Abdominal: Soft. Bowel sounds are normal.  Musculoskeletal: She exhibits no edema or tenderness.  Neurological: She is alert and oriented to person, place, and time.  Skin: Skin is warm and dry. She is not diaphoretic.  Psychiatric: She has a normal mood and affect.    Labs reviewed: Basic Metabolic Panel:  Recent Labs  16/10/96 1954  NA 140  K 4.0  CL 102  CO2 31  GLUCOSE 113*  BUN 26*  CREATININE 0.54  CALCIUM 8.7   Liver Function Tests:  Recent Labs  11/30/14 1954  AST 18  ALT 13  ALKPHOS 41  BILITOT 0.6  PROT 6.5  ALBUMIN 3.6    Recent Labs  11/30/14 1954  LIPASE 31   No results for input(s): AMMONIA in the last 8760 hours. CBC:  Recent Labs  11/30/14 1954  WBC 7.7  NEUTROABS 6.4  HGB 12.3  HCT 38.7  MCV 104.3*  PLT 228   TSH: No results for input(s): TSH in the last 8760 hours. A1C: No results found for: HGBA1C Lipid Panel: No results for input(s): CHOL, HDL, LDLCALC, TRIG, CHOLHDL, LDLDIRECT in the last 8760 hours.   Assessment/Plan 1. Lung mass -no new imagining, pt not wanting treatment. No shortness of breath or weight loss.   2. Generalized abdominal pain -has resolved. Good appetite and BMs  3. Cough Only symptoms of lung mass, using cough drops. Cough has been unchanged at this time.  Pt does not wish to schedule follow up. Will follow up as needed or in 1 year.

## 2015-12-28 ENCOUNTER — Emergency Department (HOSPITAL_COMMUNITY)
Admission: EM | Admit: 2015-12-28 | Discharge: 2015-12-28 | Disposition: A | Discharge status: Home / Self Care | Payer: Medicare Other | Attending: Emergency Medicine | Admitting: Emergency Medicine

## 2015-12-28 ENCOUNTER — Emergency Department (HOSPITAL_COMMUNITY): Payer: Medicare Other

## 2015-12-28 ENCOUNTER — Encounter (HOSPITAL_COMMUNITY): Payer: Self-pay | Admitting: Emergency Medicine

## 2015-12-28 DIAGNOSIS — W1839XA Other fall on same level, initial encounter: Secondary | ICD-10-CM | POA: Diagnosis not present

## 2015-12-28 DIAGNOSIS — Y998 Other external cause status: Secondary | ICD-10-CM | POA: Diagnosis not present

## 2015-12-28 DIAGNOSIS — Z872 Personal history of diseases of the skin and subcutaneous tissue: Secondary | ICD-10-CM | POA: Diagnosis not present

## 2015-12-28 DIAGNOSIS — Z8781 Personal history of (healed) traumatic fracture: Secondary | ICD-10-CM | POA: Diagnosis not present

## 2015-12-28 DIAGNOSIS — N390 Urinary tract infection, site not specified: Secondary | ICD-10-CM | POA: Diagnosis not present

## 2015-12-28 DIAGNOSIS — R1030 Lower abdominal pain, unspecified: Secondary | ICD-10-CM | POA: Diagnosis present

## 2015-12-28 DIAGNOSIS — Z8739 Personal history of other diseases of the musculoskeletal system and connective tissue: Secondary | ICD-10-CM | POA: Diagnosis not present

## 2015-12-28 DIAGNOSIS — Y9289 Other specified places as the place of occurrence of the external cause: Secondary | ICD-10-CM | POA: Insufficient documentation

## 2015-12-28 DIAGNOSIS — S50811A Abrasion of right forearm, initial encounter: Secondary | ICD-10-CM | POA: Insufficient documentation

## 2015-12-28 DIAGNOSIS — Z8719 Personal history of other diseases of the digestive system: Secondary | ICD-10-CM | POA: Diagnosis not present

## 2015-12-28 DIAGNOSIS — Y9389 Activity, other specified: Secondary | ICD-10-CM | POA: Insufficient documentation

## 2015-12-28 LAB — CBC
HEMATOCRIT: 40.6 % (ref 36.0–46.0)
Hemoglobin: 13.5 g/dL (ref 12.0–15.0)
MCH: 32.9 pg (ref 26.0–34.0)
MCHC: 33.3 g/dL (ref 30.0–36.0)
MCV: 99 fL (ref 78.0–100.0)
Platelets: 274 10*3/uL (ref 150–400)
RBC: 4.1 MIL/uL (ref 3.87–5.11)
RDW: 12.7 % (ref 11.5–15.5)
WBC: 5 10*3/uL (ref 4.0–10.5)

## 2015-12-28 LAB — URINALYSIS, ROUTINE W REFLEX MICROSCOPIC
BILIRUBIN URINE: NEGATIVE
Glucose, UA: NEGATIVE mg/dL
KETONES UR: NEGATIVE mg/dL
NITRITE: POSITIVE — AB
PROTEIN: 100 mg/dL — AB
Specific Gravity, Urine: 1.016 (ref 1.005–1.030)
pH: 6 (ref 5.0–8.0)

## 2015-12-28 LAB — BASIC METABOLIC PANEL
Anion gap: 10 (ref 5–15)
BUN: 20 mg/dL (ref 6–20)
CALCIUM: 8.5 mg/dL — AB (ref 8.9–10.3)
CO2: 31 mmol/L (ref 22–32)
CREATININE: 0.64 mg/dL (ref 0.44–1.00)
Chloride: 98 mmol/L — ABNORMAL LOW (ref 101–111)
Glucose, Bld: 97 mg/dL (ref 65–99)
Potassium: 3.8 mmol/L (ref 3.5–5.1)
SODIUM: 139 mmol/L (ref 135–145)

## 2015-12-28 LAB — I-STAT TROPONIN, ED: Troponin i, poc: 0.03 ng/mL (ref 0.00–0.08)

## 2015-12-28 LAB — URINE MICROSCOPIC-ADD ON: SQUAMOUS EPITHELIAL / LPF: NONE SEEN

## 2015-12-28 MED ORDER — CEPHALEXIN 500 MG PO CAPS: 500.0000 mg | ORAL_CAPSULE | Freq: Two times a day (BID) | ORAL | Status: DC

## 2015-12-28 NOTE — ED Notes (Signed)
PTAR GIVEN RX X1 GIVEN

## 2015-12-28 NOTE — ED Provider Notes (Signed)
CSN: [id number]     Arrival date & time 12/28/15  0509 History   First MD Initiated Contact with Patient 12/28/15 0554     Chief Complaint  Patient presents with  . Fall   (Consider location/radiation/quality/duration/timing/severity/associated sxs/prior Treatment) HPI 82 y.o. female presents to the Emergency Department today after a fall from [employer]. States that she fell this morning. Unsure of LOC. Unsure of head trauma. States that she has fallen in the past. Lives alone, but has staff check on her everyday. Uses scooter for mobility. Found by staff after she did not respond to call in apartment. States some suprapubic tenderness and back pain. No dysuria. No spontaneous loss of bowel/bladder function. No numbness/tingling. No fevers. Has not tried any OTC treatments. No N/V/D. No CP/SOB/ABD. No headache. No dizziness. No blurred vision or vision loss.    Past Medical History  Diagnosis Date  . Acquired deformity of chest and rib 05/28/2007  . Lumbago 02/19/2007  . Rectal prolapse 07/10/2006  . Primary localized osteoarthrosis, unspecified site 06/05/2006  . Scoliosis (and kyphoscoliosis), idiopathic 05/14/2006  . Osteoporosis, unspecified 03/25/2006  . Edema 03/25/2006  . Fracture of neck of femur (HCC) 03/19/2006  . Seborrhea 10/04/2004  . Torticollis, unspecified 10/04/2004  . Pain in limb 12/02/2001  . Chest pain, unspecified 12/02/2001  . Unspecified urinary incontinence 02/28/2000  . Internal hemorrhoids without mention of complication 02/13/1999  . Cough 08/24/1998   Past Surgical History  Procedure Laterality Date  . Colonoscopy  2000  . Botox injection  12/2005    right neck torticollis/cervical dystonia  . Appendectomy    . Hemorrhoid surgery    . Cataract extraction     Family History  Problem Relation Age of Onset  . Stroke Mother    Social History  Substance Use Topics  . Smoking status: Never Smoker   . Smokeless tobacco: None  . Alcohol Use: No    OB History    No data available     Review of Systems ROS reviewed and all are negative for acute change except as noted in the HPI.  Allergies  Review of patient's allergies indicates no known allergies.  Home Medications   Prior to Admission medications   Medication Sig Start Date End Date Taking? Authorizing Provider  Throat Lozenges (COUGH DROPS MENTHOL MT) Use as directed 1 lozenge in the mouth or throat every 2 (two) hours as needed (sore throat).    Historical Provider, MD   BP 163/65 mmHg  Pulse 77  Resp 16  SpO2 96%   Physical Exam  Constitutional: She is oriented to person, place, and time. She appears well-developed and well-nourished.  HENT:  Head: Normocephalic and atraumatic.  Eyes: EOM are normal. Pupils are equal, round, and reactive to light.  Neck: Normal range of motion. Neck supple. No tracheal deviation present.  Cardiovascular: Normal rate, regular rhythm and normal heart sounds.   No murmur heard. Pulmonary/Chest: Effort normal and breath sounds normal. No respiratory distress. She has no wheezes. She has no rales. She exhibits no tenderness.  Abdominal: Soft. Normal appearance and bowel sounds are normal. There is tenderness in the suprapubic area. There is no rigidity, no rebound, no guarding, no CVA tenderness, no tenderness at McBurney's point and negative Murphy's sign.  Musculoskeletal: Normal range of motion.       Lumbar back: Normal.  Neurological: She is alert and oriented to person, place, and time. She has normal strength. She is not disoriented. No cranial  nerve deficit or sensory deficit.  Cranial Nerves:  II: Pupils equal, round, reactive to light III,IV, VI: ptosis not present, extra-ocular motions intact bilaterally  V,VII: smile symmetric, facial light touch sensation equal VIII: hearing grossly normal bilaterally  IX,X: midline uvula rise  XI: bilateral shoulder shrug equal and strong XII: midline tongue extension  Skin: Skin is  warm and dry.  Psychiatric: She has a normal mood and affect. Her behavior is normal. Thought content normal.  Nursing note and vitals reviewed.  ED Course  Procedures (including critical care time) Labs Review Labs Reviewed  BASIC METABOLIC PANEL - Abnormal; Notable for the following:    Chloride 98 (*)    Calcium 8.5 (*)    All other components within normal limits  URINALYSIS, ROUTINE W REFLEX MICROSCOPIC (NOT AT [facility]) - Abnormal; Notable for the following:    Color, Urine AMBER (*)    APPearance TURBID (*)    Hgb urine dipstick LARGE (*)    Protein, ur 100 (*)    Nitrite POSITIVE (*)    Leukocytes, UA LARGE (*)    All other components within normal limits  URINE MICROSCOPIC-ADD ON - Abnormal; Notable for the following:    Bacteria, UA MANY (*)    All other components within normal limits  URINE CULTURE  CBC   Imaging Review Dg Chest 2 View  12/28/2015  CLINICAL DATA:  Fall with mid back pain.  Initial encounter. EXAM: CHEST  2 VIEW COMPARISON:  10/23/2013 FINDINGS: New but chronic appearing fractures of the posterior left second, third, fourth, and fifth ribs. 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 No acute osseous finding. Chronic hyperinflation. There is no edema, consolidation, effusion, or pneumothorax. Normal heart size and mediastinal contours. IMPRESSION: 1. No acute finding. 2. Left rib and clavicle fractures have occurred since 2015 but appear chronic. Electronically Signed   By: Marnee Spring M.D.   On: 12/28/2015 06:38   Dg Forearm Right  12/28/2015  CLINICAL DATA:  Fall with skin tear on the proximal and lateral right elbow. Initial encounter. EXAM: RIGHT FOREARM - 2 VIEW COMPARISON:  None. FINDINGS: No evidence of fracture or opaque foreign body. Osteopenia. Degenerative narrowing at the elbow, radiocarpal joint, and thumb base. IMPRESSION: No acute finding. Electronically Signed   By: Marnee Spring M.D.   On: 12/28/2015 06:30    Ct Head Wo Contrast  12/28/2015  CLINICAL DATA:  Larey Seat this morning, does not recall event. EXAM: CT HEAD WITHOUT CONTRAST TECHNIQUE: Contiguous axial images were obtained from the base of the skull through the vertex without intravenous contrast. COMPARISON:  None. FINDINGS: The ventricles and sulci are normal for age. No intraparenchymal hemorrhage, mass effect nor midline shift. Confluent supratentorial white matter hypodensities. No acute large vascular territory infarcts. No abnormal extra-axial fluid collections. Basal cisterns are patent. Moderate calcific atherosclerosis of the carotid siphons. No skull fracture. The included ocular globes and orbital contents are non-suspicious. Status post bilateral ocular lens implants. Minimal paranasal sinus mucosal thickening. Mastoid air cells are well aerated. IMPRESSION: No acute intracranial process. Involutional changes. Moderate to severe chronic small vessel ischemic disease. Electronically Signed   By: Awilda Metro M.D.   On: 12/28/2015 06:42   I have personally reviewed and evaluated these images and lab results as part of my medical decision-making.   EKG Interpretation None      MDM  I have reviewed and evaluated the relevant laboratory values.I have reviewed and evaluated the  relevant imaging studies.I personally evaluated and interpreted the relevant EKG.I have reviewed the relevant previous healthcare records.I have reviewed EMS Documentation.I obtained HPI from historian. Patient discussed with supervising physician  ED Course:  Assessment: Pt is a 89yF with no sig medical hx who presents after unwitnessed fall this morning. Unsure of LOC or Head Trauma. On exam, pt in NAD. Nontoxic/nonseptic appearing. VSS. Afebrile. Lungs CTA. Heart RRR. Minor superficial abrasion noted on right forearm. Noted suprapubic tenderness. Labs show UTI. All other labs unremarkable. CT/XR Imaging show no acute abnormalities. Fall most likely  mechanical. No hx seizures. No hx syncopal episodes. Plan is to DC home with abx and follow up to PCP for further management of symptoms. Contacted Case Management due to request for home health needs as patient has unsteady gait. Able to ambulate, but requires assistance. At time of discharge, Patient is in no acute distress. Vital Signs are stable. Patient is able to ambulate. Patient able to tolerate PO.    Disposition/Plan:  DC Home Additional Verbal discharge instructions given and discussed with patient.  Pt Instructed to f/u with PCP in the next 48 hours for evaluation and treatment of symptoms. Return precautions given Pt acknowledges and agrees with plan  Supervising Physician Laurence Spatesachel Morgan Little, MD   Final diagnoses:  UTI (lower urinary tract infection)       Audry Piliyler Abisai Deer, PA-C 12/28/15 0858  Audry Piliyler Oran Dillenburg, PA-C 12/28/15 1028  Laurence Spatesachel Morgan Little, MD 01/03/16 1002

## 2015-12-28 NOTE — ED Notes (Signed)
Bed: WA09 Expected date:  Expected time:  Means of arrival:  Comments: fall 

## 2015-12-28 NOTE — Progress Notes (Signed)
[name] to see if there a preferred [facility] health agency [place] [facility] [place] not a preferred [facility] agency [facility] [name] confirms speaking with ED SW and states family now wanting [facility] "they did not feel the assisted living area would be enough" Reports [facility] there is a Charity fundraiserN with the pt for "sixteen hours"

## 2015-12-28 NOTE — ED Notes (Signed)
SOCIAL WORK PRESENT 

## 2015-12-28 NOTE — ED Notes (Signed)
Patient here from [facility] with complaints of fall this morning. Pain 10/10 chronic back pain. Skin tear right forearm. CBG 131.

## 2015-12-28 NOTE — Progress Notes (Signed)
Spoke with [name],family member who states patient cannot stay alone in her independent living apartment  [facility]  [name] and another female family member have spoken with [name] and there is availability at the assisted living level but not the snf level PDN (private duty nursing) discussed but is the preference for services Discussed that pt with UTI and ambulating - not criteria for admission  Joselyn Glassmanyler NP PA notify a need for SW Consult  CM spoke with ED SW

## 2015-12-28 NOTE — Discharge Instructions (Signed)
Please read and follow all provided instructions.  Your diagnoses today include:  1. UTI (lower urinary tract infection)    Tests performed today include:  Urine test - suggests that you have an infection in your bladder  Vital signs. See below for your results today.   Medications prescribed:   Take any medication as prescribed.   Home care instructions:  Follow any educational materials contained in this packet.  Follow-up instructions: Please follow-up with your primary care provider in 3 days if symptoms are not resolved for further evaluation of your symptoms.  Return instructions:   Please return to the Emergency Department if you experience worsening symptoms.   Return with fever, worsening pain, persistent vomiting, worsening pain in your back.   Please return if you have any other emergent concerns.  Additional Information:  Your vital signs today were: BP 163/65 mmHg   Pulse 77   Resp 16   SpO2 96% If your blood pressure (BP) was elevated above 135/85 this visit, please have this repeated by your doctor within one month. --------------

## 2015-12-28 NOTE — Progress Notes (Signed)
[name] given copies of PDN and Home health resource sheets  CM reviewed in details home health [facility](HH) (length of stay in home, [facility], coverage, primary caregiver, up to 24 hrs before services may be started) and Private duty nursing (PDN-coverage, length of stay in the home types of staff available). CM [facility] who states when [facility] a [facility] the [facility] choice of agency.  Answered questions about LPN, RN for [employer]

## 2015-12-28 NOTE — Progress Notes (Signed)
CM noted [facility] orders entered -hhrn, aide,sw [facility] staff members to be consulted about present resources and available home health resources

## 2015-12-28 NOTE — ED Notes (Signed)
Family and PT aware [facility]

## 2015-12-28 NOTE — Progress Notes (Addendum)
CSW received call from ED Secretary and staffed with Nurse CM. CSW met with patient at bedside with Nelle Don, sister-in-law and Gwyneth Revels, niece both present. Patient reports she presents to [facility] due to uncontrolled urine and that she also had a fall. Patient reports she has been at [facility] for twenty-three years and she states "it's a wonderful place". 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 Patient's niece and sister-in-law report patient lives in [facility] free living [facility] and she has been there for twenty-three years. The family does not want patient to go back to her apartment and be alone, as they feel she needs a higher level of care and they state she cannot walk. They stated no SNF beds are available at the time, therefore, they were either hoping for ALF or for patient to return to her apartment with services put in place for care. They stated they have spoken with someone at [facility] and they are waiting for a return call from the facility. CSW staffed with EDP.   The family stated they spoke with [name] at [place] states patient can be placed in memory care unit until she can be moved to an appropriate level. CSW staffed case with Asst. Social Work Mudlogger and EDP. CSW called and spoke with [place] Director at [facility] to ensure patient being placed in a memory care unit. Health Care Director confirms patient will be placed in a memory care unit upon discharge. CSW informed EDP and Nurse of this information. CSW provided nurse with contact number for [facility] [phone].   CSW spoke with family for any other questions or information. No questions noted for CSW at this time.   Genice Rouge [phone] ED CSW 12/28/2015 12:24 PM

## 2015-12-28 NOTE — ED Notes (Signed)
Unable to get oral temp due to patient shaking

## 2015-12-28 NOTE — ED Notes (Signed)
Ptar called 

## 2015-12-28 NOTE — ED Notes (Signed)
[facility] [phone]

## 2015-12-30 ENCOUNTER — Non-Acute Institutional Stay (SKILLED_NURSING_FACILITY): Payer: Medicare Other | Admitting: Adult Health

## 2015-12-30 ENCOUNTER — Encounter: Payer: Self-pay | Admitting: Adult Health

## 2015-12-30 DIAGNOSIS — N390 Urinary tract infection, site not specified: Secondary | ICD-10-CM | POA: Insufficient documentation

## 2015-12-30 DIAGNOSIS — N3000 Acute cystitis without hematuria: Secondary | ICD-10-CM

## 2015-12-30 DIAGNOSIS — K5901 Slow transit constipation: Secondary | ICD-10-CM

## 2015-12-30 DIAGNOSIS — R3915 Urgency of urination: Secondary | ICD-10-CM

## 2015-12-30 DIAGNOSIS — M436 Torticollis: Secondary | ICD-10-CM | POA: Diagnosis not present

## 2015-12-30 DIAGNOSIS — R918 Other nonspecific abnormal finding of lung field: Secondary | ICD-10-CM | POA: Diagnosis not present

## 2015-12-30 DIAGNOSIS — R531 Weakness: Secondary | ICD-10-CM | POA: Diagnosis not present

## 2015-12-30 DIAGNOSIS — R1310 Dysphagia, unspecified: Secondary | ICD-10-CM | POA: Diagnosis not present

## 2015-12-30 DIAGNOSIS — R05 Cough: Secondary | ICD-10-CM

## 2015-12-30 DIAGNOSIS — G8929 Other chronic pain: Secondary | ICD-10-CM

## 2015-12-30 DIAGNOSIS — M545 Low back pain: Secondary | ICD-10-CM | POA: Diagnosis not present

## 2015-12-30 DIAGNOSIS — R059 Cough, unspecified: Secondary | ICD-10-CM

## 2015-12-30 DIAGNOSIS — K5900 Constipation, unspecified: Secondary | ICD-10-CM | POA: Insufficient documentation

## 2015-12-30 LAB — URINE CULTURE: Culture: >=100000

## 2015-12-30 NOTE — Progress Notes (Signed)
Patient ID: [name], female   DOB: April 14, 1915, 82 y.o.   MRN: [id number]  Location:  [facility] [employer] of Service:  SNF (31) Provider:   Peggye Ley, ANP [facility] [phone]   REED, Elmarie Shiley, DO  Patient Care Team: Kermit Balo, DO as PCP - General (Geriatric Medicine) [facility]  Extended Emergency Contact Information Primary Emergency Contact: Manson Passey [place] of [place] Home Phone: [phone] Work Phone: [phone] Relation: Nephew Secondary Emergency Contact: Alcide Evener [place] Home Phone: [phone] Mobile Phone: [phone] Relation: Other  Code Status:  DNR Goals of care: Advanced Directive information Advanced Directives 02/03/2014  Does patient have an advance directive? Patient has advance directive, copy in chart  Type of Advance Directive Healthcare Power of Attorney     Chief Complaint  Patient presents with  . Acute Visit    weakness, choking    HPI:  Pt is a 82 y.o. female seen today for a follow up after an ER visit due to a fall and subsequent UTI dx on 12/28/15.  She report passing out and does not remember the details. She was admitted to a respite bed in the memory care unit for rehabilitation due to weakness. CT of the head without acute findings.   She previously lived independently.  However, since arriving she has been dependent for ADL's.  Her MMSE was 28/30 due to difficulty with dexterity, otherwise, she is alert and able to follow commands. The staff does report some forgetfulness regarding the details of her care. 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  Her urine culture is pending but the preliminary showed 100,000 colonies of e.coli.  CBC WNL, currently on keflex. It has been noted that she has a chronic cough and has had the heimlech maneuver multiple times due to choking on food in the [place] dining [place].  This  occurred again on 3/16 after taking a pill.  She is her own POA and has no children. She has not wanted any swallowing test or any aggressive measures due to her advanced age. It was note don CXR in the hospital that she has old rib/clavicle fractures, presumably from coughing.  She has a hx of torticolis and reports neck pain today. She declined pain meds and is very against pills in general. There is an abnormality of the left upper/lower quad of the abd that she says is old and does not want this looked into.  Her weight has decreased by 5 lbs in the past month. She reports not eating very much and has no appetite. Also noted hx of lung mass noted on CT scan in the [facility] in 2015.  Not noted on most recent xray.  Reports urgency without dysuria, long time issue.   Past Medical History  Diagnosis Date  . Acquired deformity of chest and rib 05/28/2007  . Lumbago 02/19/2007  . Rectal prolapse 07/10/2006  . Primary localized osteoarthrosis, unspecified site 06/05/2006  . Scoliosis (and kyphoscoliosis), idiopathic 05/14/2006  . Osteoporosis, unspecified 03/25/2006  . Edema 03/25/2006  . Fracture of neck of femur (HCC) 03/19/2006  . Seborrhea 10/04/2004  . Torticollis, unspecified 10/04/2004  . Pain in limb 12/02/2001  . Chest pain, unspecified 12/02/2001  . Unspecified urinary incontinence 02/28/2000  . Internal hemorrhoids without mention of complication 02/13/1999  . Cough 08/24/1998   Past Surgical History  Procedure Laterality Date  . Colonoscopy  2000  . Botox injection  12/2005    right neck torticollis/cervical dystonia  . Appendectomy    . Hemorrhoid surgery    . Cataract extraction      No Known Allergies    Medication List       This list is accurate as of: 12/30/15 10:21 AM.  Always use your most recent med list.               cephALEXin 500 MG capsule  Commonly known as:  KEFLEX  Take 1 capsule (500 mg total) by mouth 2 (two) times daily.     COUGH DROPS MENTHOL MT  Use as  directed 1 lozenge in the mouth or throat every 2 (two) hours as needed (sore throat).     docusate sodium 100 MG capsule  Commonly known as:  COLACE  Take 100 mg by mouth 2 (two) times daily.        Review of Systems  Constitutional: Positive for activity change, appetite change and unexpected weight change. Negative for fever, chills, diaphoresis and fatigue.  HENT: Negative for congestion.   Eyes: Negative for discharge, itching and visual disturbance.       Wears reading glasses  Respiratory: Positive for cough and choking. Negative for shortness of breath and wheezing.   Cardiovascular: Negative for chest pain, palpitations and leg swelling.  Genitourinary: Positive for urgency and frequency. Negative for dysuria, flank pain, enuresis and difficulty urinating.  Neurological: Positive for weakness. Negative for dizziness, tremors, seizures, syncope, facial asymmetry, speech difficulty, light-headedness, numbness and headaches.  Psychiatric/Behavioral: Negative for behavioral problems, confusion and agitation.    Immunization History  Administered Date(s) Administered  . Influenza Whole 08/14/2013  . Influenza-Unspecified 07/29/2014, 08/04/2015   Pertinent  Health Maintenance Due  Topic Date Due  . DEXA SCAN  08/27/1982  . PNA vac Low Risk Adult (1 of 2 - PCV13) 08/27/1982  . INFLUENZA VACCINE  05/15/2016   No flowsheet data found. Functional Status Survey:    Filed Vitals:   12/30/15 1014  BP: 102/51  Pulse: 73  Temp: 97.4 F (36.3 C)  Resp: 16  Weight: 83 lb 8 oz (37.875 kg)  SpO2: 97%   Body mass index is 16.59 kg/(m^2).  Wt Readings from Last 3 Encounters:  12/30/15 83 lb 8 oz (37.875 kg)  12/08/14 88 lb (39.917 kg)  02/03/14 87 lb (39.463 kg)    Physical Exam  Constitutional: She is oriented to person, place, and time. No distress.  HENT:  Head: Normocephalic and atraumatic.  Right Ear: External ear normal.  Left Ear: External ear normal.  Nose: Nose  normal.  Mouth/Throat: Oropharynx is clear and moist. No oropharyngeal exudate.  Eyes: Conjunctivae and EOM are normal. Right eye exhibits no discharge. Left eye exhibits no discharge.  Neck: No JVD present. No tracheal deviation present. No thyromegaly present.  Decreased ROM  Cardiovascular: Normal rate, regular rhythm and normal heart sounds.   No murmur heard. No edema  Pulmonary/Chest: Effort normal and breath sounds normal. No respiratory distress. She has no wheezes.  Abdominal: Soft. Bowel sounds are normal. She exhibits distension (LUQ and LLQ not tender). There is no tenderness. There is no rebound and no guarding.  Lymphadenopathy:    She has no cervical adenopathy.  Neurological: She is alert and oriented to person, place, and time. No cranial nerve deficit.  Skin: Skin is warm and dry. She is not diaphoretic.  Psychiatric: She has a normal mood and affect.    Labs reviewed:  Recent Labs  12/28/15 0647  NA 139  K 3.8  CL 98*  CO2 31  GLUCOSE 97  BUN 20  CREATININE 0.64  CALCIUM 8.5*   No results for input(s): AST, ALT, ALKPHOS, BILITOT, PROT, ALBUMIN in the last 8760 hours.  Recent Labs  12/28/15 0647  WBC 5.0  HGB 13.5  HCT 40.6  MCV 99.0  PLT 274   No results found for: TSH No results found for: HGBA1C No results found for: CHOL, HDL, LDLCALC, LDLDIRECT, TRIG, CHOLHDL  Significant Diagnostic Results in last 30 days:  Dg Chest 2 View  12/28/2015  CLINICAL DATA:  Fall with mid back pain.  Initial encounter. EXAM: CHEST  2 VIEW COMPARISON:  10/23/2013 FINDINGS: New but chronic appearing fractures of the posterior left second, third, fourth, and fifth ribs. 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 No acute osseous finding. Chronic hyperinflation. There is no edema, consolidation, effusion, or pneumothorax. Normal heart size and mediastinal contours. IMPRESSION: 1. No acute finding. 2. Left rib and clavicle fractures have  occurred since 2015 but appear chronic. Electronically Signed   By: Marnee Spring M.D.   On: 12/28/2015 06:38   Dg Forearm Right  12/28/2015  CLINICAL DATA:  Fall with skin tear on the proximal and lateral right elbow. Initial encounter. EXAM: RIGHT FOREARM - 2 VIEW COMPARISON:  None. FINDINGS: No evidence of fracture or opaque foreign body. Osteopenia. Degenerative narrowing at the elbow, radiocarpal joint, and thumb base. IMPRESSION: No acute finding. Electronically Signed   By: Marnee Spring M.D.   On: 12/28/2015 06:30   Ct Head Wo Contrast  12/28/2015  CLINICAL DATA:  Larey Seat this morning, does not recall event. EXAM: CT HEAD WITHOUT CONTRAST TECHNIQUE: Contiguous axial images were obtained from the base of the skull through the vertex without intravenous contrast. COMPARISON:  None. FINDINGS: The ventricles and sulci are normal for age. No intraparenchymal hemorrhage, mass effect nor midline shift. Confluent supratentorial white matter hypodensities. No acute large vascular territory infarcts. No abnormal extra-axial fluid collections. Basal cisterns are patent. Moderate calcific atherosclerosis of the carotid siphons. No skull fracture. The included ocular globes and orbital contents are non-suspicious. Status post bilateral ocular lens implants. Minimal paranasal sinus mucosal thickening. Mastoid air cells are well aerated. IMPRESSION: No acute intracranial process. Involutional changes. Moderate to severe chronic small vessel ischemic disease. Electronically Signed   By: Awilda Metro M.D.   On: 12/28/2015 06:42    Assessment/Plan  1. Acute cystitis without hematuria -on keflex, no fever or dysuria -await C and S  2. Slow transit constipation -start colace 100 mg BID on 3/18 -MOM 30 cc today  3. Cough -most likely due to dysphagia and/or presbyesophagus -currently using lozenges -could consider reflux as a cause  4. Dysphagia -most likely the reason for #1 -change diet to mech  soft with chopped meat -crush meds -ST to eval and tx  5. Lung mass -no monitoring or intervention per her wishes -has weight loss but this may be due to recent illness -no SOB or CP  6. Chronic bilateral low back pain without sciatica -not noted today  7. Weakness -due to advanced age and acute UTI -PT and OT to eval and tx -unclear if she will be able to return home, she is too weak and dependent on staff for ADL's at this point  8. Urgency -long term issue, most likely OAB -does not want meds -continuing toileting schedule  9. Torticollis -tylenol 650 mg q  6 hrs prn -heating pad prn  Family/ staff Communication: discussed with resident and staff  Labs/tests ordered:  NA   Peggye Leyhristy Tonnie Friedel, ANP [facility] [phone]

## 2015-12-31 ENCOUNTER — Telehealth (HOSPITAL_BASED_OUTPATIENT_CLINIC_OR_DEPARTMENT_OTHER): Payer: Self-pay | Admitting: Emergency Medicine

## 2015-12-31 NOTE — Telephone Encounter (Signed)
Post ED Visit - Positive Culture Follow-up  Culture report reviewed by antimicrobial stewardship pharmacist:  []  Enzo BiNathan Batchelder, Pharm.D. []  Celedonio MiyamotoJeremy Frens, Pharm.D., BCPS [x]  Garvin FilaMike Maccia, Pharm.D. []  Georgina PillionElizabeth Martin, Pharm.D., BCPS []  [place]boroMinh Pham, [address] Rainbow BoulevardPharm.D., BCPS, AAHIVP []  Estella HuskMichelle Turner, Pharm.D., BCPS, AAHIVP []  Tennis Mustassie Stewart, Pharm.D. []  Sherle Poeob Vincent, [place]D.  Positive urine culture E. coli Treated with cephalexin, organism sensitive to the same and no further patient follow-up is required at this time.  Berle MullMiller, Evangaline Jou 12/31/2015, 9:45 AM

## 2016-01-03 ENCOUNTER — Encounter: Payer: Self-pay | Admitting: Internal Medicine

## 2016-01-03 ENCOUNTER — Non-Acute Institutional Stay (SKILLED_NURSING_FACILITY): Payer: Medicare Other | Admitting: Internal Medicine

## 2016-01-03 DIAGNOSIS — R918 Other nonspecific abnormal finding of lung field: Secondary | ICD-10-CM

## 2016-01-03 DIAGNOSIS — R3915 Urgency of urination: Secondary | ICD-10-CM

## 2016-01-03 DIAGNOSIS — M545 Low back pain, unspecified: Secondary | ICD-10-CM

## 2016-01-03 DIAGNOSIS — G8929 Other chronic pain: Secondary | ICD-10-CM | POA: Diagnosis not present

## 2016-01-03 DIAGNOSIS — N3000 Acute cystitis without hematuria: Secondary | ICD-10-CM

## 2016-01-03 DIAGNOSIS — K5901 Slow transit constipation: Secondary | ICD-10-CM | POA: Diagnosis not present

## 2016-01-03 DIAGNOSIS — R531 Weakness: Secondary | ICD-10-CM | POA: Diagnosis not present

## 2016-01-03 NOTE — Progress Notes (Signed)
Patient ID: Ewing SchleinMargaret Moran, female   DOB: 07-29-1915, 82 y.o.   MRN: [id number]  Provider:  Gwenith Spitziffany L. Renato Gailseed, D.O., C.M.D. [facility] Room Number: 362 Place of Service:  SNF (31)  PCP: Bufford SpikesEED, Odarius Dines, DO Patient Care Team: Kermit Baloiffany L Markeith Jue, DO as PCP - General (Geriatric Medicine) [facility]  Extended Emergency Contact Information Primary Emergency Contact: Alcide EvenerSomers,[name]  [place] Home Phone: [phone] Mobile Phone: [phone] Relation: Other Secondary Emergency Contact: Manson PasseyBerry,M.Douglas  [place] Home Phone: [phone] Work Phone: [phone] Relation: Nephew  Code Status: DNR Goals of Care: Advanced Directive information Advanced Directives 01/14/2016  Does patient have an advance directive? Yes  Type of Estate agentAdvance Directive Healthcare Power of [place];Out of facility DNR (pink MOST or yellow form)  Does patient want to make changes to advanced directive? No - Patient declined  Copy of advanced directive(s) in chart? -  Pre-existing out of facility DNR order (yellow form or pink MOST form) -   Chief Complaint  Patient presents with  . New Admit To SNF    to [facility], ER visit for falling    HPI: Patient is a 82 y.o. female seen today for admission to a respite room in memory care as rehab is full--she has been getting increasingly frail and having more difficulty caring for herself in her independent living apt.  She has a h/o a lung mass, gradual weight loss, and recently some uti symptoms with urgency with small volumes excreted each time.  She was started on keflex initially, but was changed to cipro 500mg  over the weekend when the culture report returned.  She had been using a [place] chair for mobility in [place], but here is in a regular wheelchair.  She is very upset about losing her independence and talks with me about the psychological ramifications of this  transition.  She is adamant that she must return home soon.  She is feeling better.  After this discussion, some nursing staff reported she was doing very well and could leave soon, but then I saw her requiring assistance to use the restroom a few mins later.  She is receiving PT, OT and now increased personal care assistance while here.  She scored 28/30 on her mmse and passed her clock drawing.  She is upset that she cannot use her computer as she typically does for much of her time in [place].  She has not been safely ambulatory with a walker recently.  I am told she lives in the apt at the far end of the [place] unit also making it hard to reach her if an emergency should occur.     Past Medical History  Diagnosis Date  . Acquired deformity of chest and rib 05/28/2007  . Lumbago 02/19/2007  . Rectal prolapse 07/10/2006  . Primary localized osteoarthrosis, unspecified site 06/05/2006  . Scoliosis (and kyphoscoliosis), idiopathic 05/14/2006  . Osteoporosis, unspecified 03/25/2006  . Edema 03/25/2006  . Fracture of neck of femur (HCC) 03/19/2006  . Seborrhea 10/04/2004  . Torticollis, unspecified 10/04/2004  . Pain in limb 12/02/2001  . Chest pain, unspecified 12/02/2001  . Unspecified urinary incontinence 02/28/2000  . Internal hemorrhoids without mention of complication 02/13/1999  . Cough 08/24/1998   Past Surgical History  Procedure Laterality Date  . Colonoscopy  2000  . Botox injection  12/2005    right neck torticollis/cervical dystonia  . Appendectomy    . Hemorrhoid surgery    .  Cataract extraction      reports that she has never smoked. She does not have any smokeless tobacco history on file. She reports that she does not drink alcohol or use illicit drugs. Social History   Social History  . Marital Status: Single    Spouse Name: N/A  . Number of Children: N/A  . Years of Education: N/A   Occupational History  . retired Psychiatrist Professor    Social History Main Topics  . Smoking status:  Never Smoker   . Smokeless tobacco: Not on file  . Alcohol Use: No  . Drug Use: No  . Sexual Activity: No   Other Topics Concern  . Not on file   Social History Narrative   Patient is Single, never married, no children. 1 of 13 siblings.   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; is liaison w/ [employer]   Lives in  apartment, Independent Living [facility] at [facility] since [facility]   No Smoking history , no  alcohol   Patient has  Advanced planning documents: Living Will, DNR, POA   Power wheelchair                Functional Status Survey: Is the patient deaf or have difficulty hearing?: No Does the patient have difficulty seeing, even when wearing glasses/contacts?: No Does the patient have difficulty concentrating, remembering, or making decisions?: No Does the patient have difficulty walking or climbing stairs?: Yes Does the patient have difficulty dressing or bathing?: Yes Does the patient have difficulty doing errands alone such as visiting a doctor's office or shopping?: Yes  Family History  Problem Relation Age of Onset  . Stroke Mother     Health Maintenance  Topic Date Due  . Janet Berlin  08/[birth year]/1936  . ZOSTAVAX  08/27/1977  . DEXA SCAN  08/27/1982  . PNA vac Low Risk Adult (1 of 2 - PCV13) 08/27/1982  . INFLUENZA VACCINE  05/15/2016    No Known Allergies    Medication List       This list is accurate as of: 01/03/16 11:59 PM.  Always use your most recent med list.               acetaminophen 650 MG CR tablet  Commonly known as:  TYLENOL  Take 650 mg by mouth every 6 (six) hours as needed for pain.     ciprofloxacin 500 MG tablet  Commonly known as:  CIPRO  Take 500 mg by mouth 2 (two) times daily.     COUGH DROPS MENTHOL MT  Use as directed 1 lozenge in the mouth or throat every 2 (two) hours as needed (sore throat).     docusate sodium 100 MG capsule    Commonly known as:  COLACE  Take 100 mg by mouth 2 (two) times daily. May change to liquid if necessary, hold if diarrhea        Review of Systems  Constitutional: Positive for activity change and unexpected weight change. Negative for fever and chills.  HENT: Negative for congestion.   Eyes: Negative for visual disturbance.  Respiratory: Negative for chest tightness and shortness of breath.   Cardiovascular: Negative for chest pain, palpitations and leg swelling.  Gastrointestinal: Negative for nausea, vomiting, abdominal pain, diarrhea and constipation.  Genitourinary: Positive for urgency and frequency. Negative for dysuria and hematuria.  Musculoskeletal: Positive for back pain and gait problem.  Neurological: Positive for weakness. Negative for dizziness, light-headedness and headaches.  Hematological: Bruises/bleeds easily.  Psychiatric/Behavioral: Negative for confusion.    Filed Vitals:   01/03/16 1536  BP: 131/60  Pulse: 93  Temp: 97.7 F (36.5 C)  TempSrc: Oral  Resp: 16  Weight: 88 lb (39.917 kg)  SpO2: 96%   Body mass index is 17.48 kg/(m^2). Physical Exam  Constitutional: She is oriented to person, place, and time. No distress.  HENT:  Head: Normocephalic and atraumatic.  Eyes: EOM are normal. Pupils are equal, round, and reactive to light.  Neck: Neck supple. No JVD present.  Cardiovascular: Normal rate, regular rhythm, normal heart sounds and intact distal pulses.   Pulmonary/Chest: Effort normal and breath sounds normal. No respiratory distress.  Abdominal: Soft. Bowel sounds are normal. She exhibits no distension. There is no tenderness. There is no rebound and no guarding.  Genitourinary:  No suprapubic tenderness or fullness  Musculoskeletal: Normal range of motion.  Frail white female able to move all joints, crepitus present, seen seated in wheelchair  Lymphadenopathy:    She has no cervical adenopathy.  Neurological: She is alert and oriented to  person, place, and time.  Skin: Skin is warm and dry.  Psychiatric: She has a normal mood and affect.    Labs reviewed: Basic Metabolic Panel:  Recent Labs  96/04/54 0647 01/14/16 1150  NA 139 140  K 3.8 4.2  CL 98* 104  CO2 31 29  GLUCOSE 97 95  BUN 20 24*  CREATININE 0.64 0.62  CALCIUM 8.5* 8.2*   Liver Function Tests: No results for input(s): AST, ALT, ALKPHOS, BILITOT, PROT, ALBUMIN in the last 8760 hours. No results for input(s): LIPASE, AMYLASE in the last 8760 hours. No results for input(s): AMMONIA in the last 8760 hours. CBC:  Recent Labs  12/28/15 0647 01/14/16 1150  WBC 5.0 7.7  NEUTROABS  --  7.1  HGB 13.5 11.2*  HCT 40.6 34.7*  MCV 99.0 99.7  PLT 274 283   Cardiac Enzymes: No results for input(s): CKTOTAL, CKMB, CKMBINDEX, TROPONINI in the last 8760 hours. BNP: Invalid input(s): POCBNP No results found for: HGBA1C No results found for: TSH No results found for: VITAMINB12 No results found for: FOLATE No results found for: IRON, TIBC, FERRITIN  Imaging and Procedures obtained prior to SNF admission: Dg Chest 2 View  12/28/2015  CLINICAL DATA:  Fall with mid back pain.  Initial encounter. EXAM: CHEST  2 VIEW COMPARISON:  10/23/2013 FINDINGS: New but chronic appearing fractures of the posterior left second, third, fourth, and fifth ribs. 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 No acute osseous finding. Chronic hyperinflation. There is no edema, consolidation, effusion, or pneumothorax. Normal heart size and mediastinal contours. IMPRESSION: 1. No acute finding. 2. Left rib and clavicle fractures have occurred since 2015 but appear chronic. Electronically Signed   By: Marnee Spring M.D.   On: 12/28/2015 06:38   Dg Forearm Right  12/28/2015  CLINICAL DATA:  Fall with skin tear on the proximal and lateral right elbow. Initial encounter. EXAM: RIGHT FOREARM - 2 VIEW COMPARISON:  None. FINDINGS: No evidence of fracture or  opaque foreign body. Osteopenia. Degenerative narrowing at the elbow, radiocarpal joint, and thumb base. IMPRESSION: No acute finding. Electronically Signed   By: Marnee Spring M.D.   On: 12/28/2015 06:30   Ct Head Wo Contrast  12/28/2015  CLINICAL DATA:  Larey Seat this morning, does not recall event. EXAM: CT HEAD WITHOUT CONTRAST TECHNIQUE: Contiguous axial images were obtained from the base  of the skull through the vertex without intravenous contrast. COMPARISON:  None. FINDINGS: The ventricles and sulci are normal for age. No intraparenchymal hemorrhage, mass effect nor midline shift. Confluent supratentorial white matter hypodensities. No acute large vascular territory infarcts. No abnormal extra-axial fluid collections. Basal cisterns are patent. Moderate calcific atherosclerosis of the carotid siphons. No skull fracture. The included ocular globes and orbital contents are non-suspicious. Status post bilateral ocular lens implants. Minimal paranasal sinus mucosal thickening. Mastoid air cells are well aerated. IMPRESSION: No acute intracranial process. Involutional changes. Moderate to severe chronic small vessel ischemic disease. Electronically Signed   By: Awilda Metro M.D.   On: 12/28/2015 06:42    Assessment/Plan 1. Acute cystitis without hematuria -completing course of cipro 500mg  po bid for UTI -continues to have some urgency and has functional difficulties making it to restroom due to frail state  2. Slow transit constipation -d/c colace due to loose stools (likely from abx), cdiff negative at hospital  3. Lung mass -has had COPD on xray -mass has been present since 10/23/13 CXR in the right perihilar region suggestive of bronchogenic ca; CT 10/28/13 showed 24x52mm superior segment right upper lob pulmonary lesion with adjacent postobstructive atelectasis worrisome for pulmonary neoplasm, PET CT was recommended; also had small scattered sub 3mm pulmonary nodules, but no obvious  mediastinal or hilar lymphadenopathy or osseous mets -CT abd/pelvis a month later showed only a horseshoe kidney and bibasilar atelectasis -pt had refused further workup per notes from NP Krell at that time -CXR 12/28/15 showed left rib and clavicle fractures sometime between 2015 and then, but not acute -CT head showed involutional changes and moderate to severe chronic small vessel ischemic disease -no further chest imaging has been obtained at the patient's request--she's chosen comfort measures  4. Chronic bilateral low back pain without sciatica -c/o this, but will not agree to take anything stronger than tylenol as needed  5. Weakness -here for therapy due to falls, increasing frailty, continue with this -if she does return, she will need a caregiver 24x7 likely to accompany her  6. Urinary urgency -suspect her urinary issues are not brand new from UTI, but more functional incontinence and some degree of OAB possibly with some retention -encourage hydration and good pericare/proper wiping to prevent more UTIs  Family/ staff Communication: discussed with [employer] nursing, supervisor and DNS  Labs/tests ordered: no new, just had labs in ED that suggested she's dry and urine positive, otherwise unremarkable  Maury Groninger L. Sirenia Whitis, D.O. Geriatrics [facility] [address], [place] [address] Cell Phone (Mon-Fri 8am-5pm):  [phone] On Call:  [phone] & follow prompts after 5pm & weekends Office Phone:  [phone] Office Fax:  [phone]

## 2016-01-14 ENCOUNTER — Emergency Department (HOSPITAL_COMMUNITY)
Admission: EM | Admit: 2016-01-14 | Discharge: 2016-01-14 | Disposition: A | Discharge status: Home / Self Care | Payer: Medicare Other | Attending: Emergency Medicine | Admitting: Emergency Medicine

## 2016-01-14 ENCOUNTER — Telehealth (HOSPITAL_COMMUNITY): Payer: Self-pay

## 2016-01-14 ENCOUNTER — Encounter (HOSPITAL_COMMUNITY): Payer: Self-pay | Admitting: Emergency Medicine

## 2016-01-14 DIAGNOSIS — R197 Diarrhea, unspecified: Secondary | ICD-10-CM | POA: Insufficient documentation

## 2016-01-14 DIAGNOSIS — Z872 Personal history of diseases of the skin and subcutaneous tissue: Secondary | ICD-10-CM | POA: Insufficient documentation

## 2016-01-14 DIAGNOSIS — R531 Weakness: Secondary | ICD-10-CM | POA: Insufficient documentation

## 2016-01-14 DIAGNOSIS — Z8719 Personal history of other diseases of the digestive system: Secondary | ICD-10-CM | POA: Diagnosis not present

## 2016-01-14 DIAGNOSIS — N390 Urinary tract infection, site not specified: Secondary | ICD-10-CM | POA: Insufficient documentation

## 2016-01-14 DIAGNOSIS — M419 Scoliosis, unspecified: Secondary | ICD-10-CM | POA: Diagnosis not present

## 2016-01-14 DIAGNOSIS — R630 Anorexia: Secondary | ICD-10-CM | POA: Insufficient documentation

## 2016-01-14 DIAGNOSIS — M1991 Primary osteoarthritis, unspecified site: Secondary | ICD-10-CM | POA: Insufficient documentation

## 2016-01-14 DIAGNOSIS — Z8781 Personal history of (healed) traumatic fracture: Secondary | ICD-10-CM | POA: Insufficient documentation

## 2016-01-14 LAB — BASIC METABOLIC PANEL
ANION GAP: 7 (ref 5–15)
BUN: 24 mg/dL — ABNORMAL HIGH (ref 6–20)
CALCIUM: 8.2 mg/dL — AB (ref 8.9–10.3)
CO2: 29 mmol/L (ref 22–32)
CREATININE: 0.62 mg/dL (ref 0.44–1.00)
Chloride: 104 mmol/L (ref 101–111)
GFR calc Af Amer: >60 mL/min (ref >60)
GLUCOSE: 95 mg/dL (ref 65–99)
Potassium: 4.2 mmol/L (ref 3.5–5.1)
Sodium: 140 mmol/L (ref 135–145)

## 2016-01-14 LAB — CBC WITH DIFFERENTIAL/PLATELET
BASOS ABS: 0 10*3/uL (ref 0.0–0.1)
BASOS PCT: 0 %
EOS PCT: 1 %
Eosinophils Absolute: 0.1 10*3/uL (ref 0.0–0.7)
HCT: 34.7 % — ABNORMAL LOW (ref 36.0–46.0)
Hemoglobin: 11.2 g/dL — ABNORMAL LOW (ref 12.0–15.0)
LYMPHS PCT: 3 %
Lymphs Abs: 0.3 10*3/uL — ABNORMAL LOW (ref 0.7–4.0)
MCH: 32.2 pg (ref 26.0–34.0)
MCHC: 32.3 g/dL (ref 30.0–36.0)
MCV: 99.7 fL (ref 78.0–100.0)
MONO ABS: 0.3 10*3/uL (ref 0.1–1.0)
MONOS PCT: 4 %
Neutro Abs: 7.1 10*3/uL (ref 1.7–7.7)
Neutrophils Relative %: 92 %
PLATELETS: 283 10*3/uL (ref 150–400)
RBC: 3.48 MIL/uL — ABNORMAL LOW (ref 3.87–5.11)
RDW: 12.9 % (ref 11.5–15.5)
WBC: 7.7 10*3/uL (ref 4.0–10.5)

## 2016-01-14 LAB — URINALYSIS, ROUTINE W REFLEX MICROSCOPIC
Bilirubin Urine: NEGATIVE
Glucose, UA: NEGATIVE mg/dL
Ketones, ur: NEGATIVE mg/dL
Nitrite: NEGATIVE
PROTEIN: NEGATIVE mg/dL
SPECIFIC GRAVITY, URINE: 1.018 (ref 1.005–1.030)
pH: 6 (ref 5.0–8.0)

## 2016-01-14 LAB — URINE MICROSCOPIC-ADD ON

## 2016-01-14 LAB — C DIFFICILE QUICK SCREEN W PCR REFLEX
C DIFFICILE (CDIFF) INTERP: NEGATIVE
C Diff antigen: NEGATIVE
C Diff toxin: NEGATIVE

## 2016-01-14 MED ORDER — PHENAZOPYRIDINE HCL 100 MG PO TABS
100.0000 mg | ORAL_TABLET | Freq: Once | ORAL | Status: AC
  Administered 2016-01-14: 100 mg via ORAL
  Filled 2016-01-14: qty 1

## 2016-01-14 MED ORDER — SODIUM CHLORIDE 0.9 % IV BOLUS (SEPSIS)
500.0000 mL | Freq: Once | INTRAVENOUS | Status: AC
  Administered 2016-01-14: 500 mL via INTRAVENOUS

## 2016-01-14 MED ORDER — PHENAZOPYRIDINE HCL 200 MG PO TABS: 200.0000 mg | ORAL_TABLET | Freq: Three times a day (TID) | ORAL | Status: DC

## 2016-01-14 MED ORDER — DIPHENOXYLATE-ATROPINE 2.5-0.025 MG PO TABS
1.0000 | ORAL_TABLET | Freq: Once | ORAL | Status: AC
  Administered 2016-01-14: 1 via ORAL
  Filled 2016-01-14: qty 1

## 2016-01-14 MED ORDER — FOSFOMYCIN TROMETHAMINE 3 G PO PACK
3.0000 g | PACK | Freq: Once | ORAL | Status: AC
  Administered 2016-01-14: 3 g via ORAL
  Filled 2016-01-14: qty 3

## 2016-01-14 MED ORDER — DIPHENOXYLATE-ATROPINE 2.5-0.025 MG PO TABS: 1.0000 | ORAL_TABLET | Freq: Four times a day (QID) | ORAL | Status: DC | PRN

## 2016-01-14 NOTE — ED Notes (Signed)
Awaiting transport by [facility] back to [facility] facility. Family member at bedside.

## 2016-01-14 NOTE — Discharge Instructions (Signed)
Patient received fosfomycin as a one time only treatment for urinary tract infection. Recommend follow-up urine culture in one week. Please check results of current urine culture on Monday or Tuesday. Stop Colace. Lomotil once or twice per day only for any persistent diarrhea. Encourage fluids. 24/7 CNA as discussed with nursing supervisor until reevaluation on Monday with physician/NP/PA staff     Diarrhea Diarrhea is watery poop (stool). It can make you feel weak, tired, thirsty, or give you a dry mouth (signs of dehydration). Watery poop is a sign of another problem, most often an infection. It often lasts 2-3 days. It can last longer if it is a sign of something serious. Take care of yourself as told by your doctor. HOME CARE   Drink 1 cup (8 ounces) of fluid each time you have watery poop.  Do not drink the following fluids:  Those that contain simple sugars (fructose, glucose, galactose, lactose, sucrose, maltose).  Sports drinks.  Fruit juices.  Whole milk products.  Sodas.  Drinks with caffeine (coffee, tea, soda) or alcohol.  Oral rehydration solution may be used if the doctor says it is okay. You may make your own solution. Follow this recipe:   - teaspoon table salt.   teaspoon baking soda.   teaspoon salt substitute containing potassium chloride.  1 tablespoons sugar.  1 liter (34 ounces) of water.  Avoid the following foods:  High fiber foods, such as raw fruits and vegetables.  Nuts, seeds, and whole grain breads and cereals.   Those that are sweetened with sugar alcohols (xylitol, sorbitol, mannitol).  Try eating the following foods:  Starchy foods, such as rice, toast, pasta, low-sugar cereal, oatmeal, baked potatoes, crackers, and bagels.  Bananas.  Applesauce.  Eat probiotic-rich foods, such as yogurt and milk products that are fermented.  Wash your hands well after each time you have watery poop.  Only take medicine as told by your  doctor.  Take a warm bath to help lessen burning or pain from having watery poop. GET HELP RIGHT AWAY IF:   You cannot drink fluids without throwing up (vomiting).  You keep throwing up.  You have blood in your poop, or your poop looks black and tarry.  You do not pee (urinate) in 6-8 hours, or there is only a small amount of very dark pee.  You have belly (abdominal) pain that gets worse or stays in the same spot (localizes).  You are weak, dizzy, confused, or light-headed.  You have a very bad headache.  Your watery poop gets worse or does not get better.  You have a fever or lasting symptoms for more than 2-3 days.  You have a fever and your symptoms suddenly get worse. MAKE SURE YOU:   Understand these instructions.  Will watch your condition.  Will get help right away if you are not doing well or get worse.   This information is not intended to replace advice given to you by your health care provider. Make sure you discuss any questions you have with your health care provider.   Document Released: 03/19/2008 Document Revised: 10/22/2014 Document Reviewed: 06/08/2012 Elsevier Interactive Patient Education 2016 Elsevier Inc.  Urinary Tract Infection Urinary tract infections (UTIs) can develop anywhere along your urinary tract. Your urinary tract is your body's drainage system for removing wastes and extra water. Your urinary tract includes two kidneys, two ureters, a bladder, and a urethra. Your kidneys are a pair of bean-shaped organs. Each kidney is about the size  of your fist. They are located below your ribs, one on each side of your spine. CAUSES Infections are caused by microbes, which are microscopic organisms, including fungi, viruses, and bacteria. These organisms are so small that they can only be seen through a microscope. Bacteria are the microbes that most commonly cause UTIs. SYMPTOMS  Symptoms of UTIs may vary by age and gender of the patient and by the  location of the infection. Symptoms in young women typically include a frequent and intense urge to urinate and a painful, burning feeling in the bladder or urethra during urination. Older women and men are more likely to be tired, shaky, and weak and have muscle aches and abdominal pain. A fever may mean the infection is in your kidneys. Other symptoms of a kidney infection include pain in your back or sides below the ribs, nausea, and vomiting. DIAGNOSIS To diagnose a UTI, your caregiver will ask you about your symptoms. Your caregiver will also ask you to provide a urine sample. The urine sample will be tested for bacteria and white blood cells. White blood cells are made by your body to help fight infection. TREATMENT  Typically, UTIs can be treated with medication. Because most UTIs are caused by a bacterial infection, they usually can be treated with the use of antibiotics. The choice of antibiotic and length of treatment depend on your symptoms and the type of bacteria causing your infection. HOME CARE INSTRUCTIONS  If you were prescribed antibiotics, take them exactly as your caregiver instructs you. Finish the medication even if you feel better after you have only taken some of the medication.  Drink enough water and fluids to keep your urine clear or pale yellow.  Avoid caffeine, tea, and carbonated beverages. They tend to irritate your bladder.  Empty your bladder often. Avoid holding urine for long periods of time.  Empty your bladder before and after sexual intercourse.  After a bowel movement, women should cleanse from front to back. Use each tissue only once. SEEK MEDICAL CARE IF:   You have back pain.  You develop a fever.  Your symptoms do not begin to resolve within 3 days. SEEK IMMEDIATE MEDICAL CARE IF:   You have severe back pain or lower abdominal pain.  You develop chills.  You have nausea or vomiting.  You have continued burning or discomfort with  urination. MAKE SURE YOU:   Understand these instructions.  Will watch your condition.  Will get help right away if you are not doing well or get worse.   This information is not intended to replace advice given to you by your health care provider. Make sure you discuss any questions you have with your health care provider.   Document Released: 07/11/2005 Document Revised: 06/22/2015 Document Reviewed: 11/09/2011 Elsevier Interactive Patient Education [employer]

## 2016-01-14 NOTE — ED Notes (Signed)
Pt going back to [facility] with IV in place due to possibility of needing more IV fluids per Dr. Fayrene FearingJames. Facility aware.

## 2016-01-14 NOTE — ED Notes (Signed)
Pt still awaiting transport back to [facility] by [facility].

## 2016-01-14 NOTE — Telephone Encounter (Signed)
[facility] calling stating didn't receive RX x 2 as dc paperwork showed.  Call sent to Amie RN @ [facility] involved in Dc for clarification.

## 2016-01-14 NOTE — ED Notes (Signed)
Bed: [facility] Expected date:  Expected time:  Means of arrival:  Comments: 

## 2016-01-14 NOTE — ED Notes (Signed)
Attempted I/O cath, only able to get 2-3 drops of urine, not enough for UA.

## 2016-01-14 NOTE — ED Notes (Signed)
Per facility request, RX for pyridium and lomotil faxed to [facility].   Patient family left without the Rx at discharge.

## 2016-01-14 NOTE — ED Notes (Signed)
Pt.'s family,niece refused for V/S taking so discharged will not be delayed.

## 2016-01-14 NOTE — ED Notes (Signed)
Pt brought in via [facility] from [facility], states pt has had diarrhea for 2 days, GI bug reported to be going around the facility where she lives. Pt denies pain, denies vomiting. Vitals per ems 102/55, HR 78, RR 16 CBG 103

## 2016-01-14 NOTE — ED Notes (Signed)
Bed: WA14 Expected date:  Expected time:  Means of arrival:  Comments: EMS 

## 2016-01-14 NOTE — ED Notes (Addendum)
Patient family notified that patient was next to be picked up for transport. Patient daughter then advised another family member was coming to pick them up due to the delay in transport.

## 2016-01-14 NOTE — ED Notes (Signed)
MD at bedside. 

## 2016-01-14 NOTE — ED Provider Notes (Signed)
CSN: [id number]     Arrival date & time 01/14/16  1120 History   First MD Initiated Contact with Patient 01/14/16 1129     No chief complaint on file.     HPI  Patient presents for evaluation of diarrhea. [facility]. Per the report of paramedics" GI bug has been going through there".  The other residents have had similar symptoms.  Patient reports diarrhea 3-4 times per day for the last 2 days. No blood. No vomiting. Poor appetite but eating and drinking some. No fever.  Patient seen and evaluated here recently with urinary tract infection and fall. History with an unknown antibiotic. I cannot find in her chart the exact amount she was given. It is no longer on her MAR from her facility  She describes generalized weakness. No syncope, or Presyncope.  Past Medical History  Diagnosis Date  . Acquired deformity of chest and rib 05/28/2007  . Lumbago 02/19/2007  . Rectal prolapse 07/10/2006  . Primary localized osteoarthrosis, unspecified site 06/05/2006  . Scoliosis (and kyphoscoliosis), idiopathic 05/14/2006  . Osteoporosis, unspecified 03/25/2006  . Edema 03/25/2006  . Fracture of neck of femur (HCC) 03/19/2006  . Seborrhea 10/04/2004  . Torticollis, unspecified 10/04/2004  . Pain in limb 12/02/2001  . Chest pain, unspecified 12/02/2001  . Unspecified urinary incontinence 02/28/2000  . Internal hemorrhoids without mention of complication 02/13/1999  . Cough 08/24/1998   Past Surgical History  Procedure Laterality Date  . Colonoscopy  2000  . Botox injection  12/2005    right neck torticollis/cervical dystonia  . Appendectomy    . Hemorrhoid surgery    . Cataract extraction     Family History  Problem Relation Age of Onset  . Stroke Mother    Social History  Substance Use Topics  . Smoking status: Never Smoker   . Smokeless tobacco: None  . Alcohol Use: No   OB History    No data available     Review of Systems  Constitutional: Positive  for activity change and appetite change. Negative for fever, chills, diaphoresis and fatigue.  HENT: Negative for mouth sores, sore throat and trouble swallowing.   Eyes: Negative for visual disturbance.  Respiratory: Negative for cough, chest tightness, shortness of breath and wheezing.   Cardiovascular: Negative for chest pain.  Gastrointestinal: Positive for diarrhea. Negative for nausea, vomiting, abdominal pain and abdominal distention.  Endocrine: Negative for polydipsia, polyphagia and polyuria.  Genitourinary: Negative for dysuria, frequency and hematuria.  Musculoskeletal: Negative for gait problem.  Skin: Negative for color change, pallor and rash.  Neurological: Positive for weakness. Negative for dizziness, syncope, light-headedness and headaches.  Hematological: Does not bruise/bleed easily.  Psychiatric/Behavioral: Negative for behavioral problems and confusion.      Allergies  Review of patient's allergies indicates no known allergies.  Home Medications   Prior to Admission medications   Medication Sig Start Date End Date Taking? Authorizing Provider  acetaminophen (TYLENOL) 650 MG CR tablet Take 650 mg by mouth every 6 (six) hours as needed for pain.   Yes Historical Provider, MD  docusate sodium (COLACE) 100 MG capsule Take 100 mg by mouth 2 (two) times daily. May change to liquid if necessary, hold if diarrhea   Yes Historical Provider, MD  Throat Lozenges (COUGH DROPS MENTHOL MT) Use as directed 1 lozenge in the mouth or throat every 2 (two) hours as needed (sore throat).   Yes Historical Provider, MD  cephALEXin (KEFLEX) 500 MG capsule  Reported on 01/14/2016 12/28/15   Historical Provider, MD  diphenoxylate-atropine (LOMOTIL) 2.5-0.025 MG tablet Take 1 tablet by mouth 4 (four) times daily as needed for diarrhea or loose stools. 01/14/16   Rolland Porter, MD  phenazopyridine (PYRIDIUM) 200 MG tablet Take 1 tablet (200 mg total) by mouth 3 (three) times daily. 01/14/16   Rolland Porter, MD   BP 126/60 mmHg  Pulse 74  Temp(Src) 98.4 F (36.9 C) (Oral)  Resp 16  SpO2 95% Physical Exam  Constitutional: She is oriented to person, place, and time. She appears well-developed and well-nourished. No distress.  Thin frail appearing 82 year old female. Awake and alert. Does not be acutely distressed. Mucous membranes of her  mouth are slightly dry  HENT:  Head: Normocephalic.  Eyes: Conjunctivae are normal. Pupils are equal, round, and reactive to light. No scleral icterus.  Neck: Normal range of motion. Neck supple. No thyromegaly present.  Cardiovascular: Normal rate and regular rhythm.  Exam reveals no gallop and no friction rub.   No murmur heard. Pulmonary/Chest: Effort normal and breath sounds normal. No respiratory distress. She has no wheezes. She has no rales.  Abdominal: Soft. Bowel sounds are normal. She exhibits no distension. There is no tenderness. There is no rebound.  Soft abdomen. No tenderness on exam. Normal active to slightly hyperactive bowel sounds  Musculoskeletal: Normal range of motion.  Neurological: She is alert and oriented to person, place, and time.  Skin: Skin is warm and dry. No rash noted.  Psychiatric: She has a normal mood and affect. Her behavior is normal.    ED Course  Procedures (including critical care time) Labs Review Labs Reviewed  CBC WITH DIFFERENTIAL/PLATELET - Abnormal; Notable for the following:    RBC 3.48 (*)    Hemoglobin 11.2 (*)    HCT 34.7 (*)    Lymphs Abs 0.3 (*)    All other components within normal limits  BASIC METABOLIC PANEL - Abnormal; Notable for the following:    BUN 24 (*)    Calcium 8.2 (*)    All other components within normal limits  URINALYSIS, ROUTINE W REFLEX MICROSCOPIC (NOT AT [facility]) - Abnormal; Notable for the following:    APPearance CLOUDY (*)    Hgb urine dipstick MODERATE (*)    Leukocytes, UA MODERATE (*)    All other components within normal limits  URINE MICROSCOPIC-ADD ON -  Abnormal; Notable for the following:    Squamous Epithelial / LPF TOO NUMEROUS TO COUNT (*)    Bacteria, UA FEW (*)    All other components within normal limits  C DIFFICILE QUICK SCREEN W PCR REFLEX  URINE CULTURE    Imaging Review No results found. I have personally reviewed and evaluated these images and lab results as part of my medical decision-making.   EKG Interpretation None      MDM   Final diagnoses:  Diarrhea, unspecified type  UTI (lower urinary tract infection)    Plan will be C. difficile toxin, fluid hydration. CBC and electrolyte exam, reevaluation.  15:00:  Patient continues to feel well. Is taking by mouth. His had only one episode of diarrhea here. Urine shows some signs of continued infection. Repeat culture pending. C. difficile negative fortunately.  I was able to speak with [facility]. They were able to provide 24 7 CNA in her room for the next 48 hours. She stated that if the patient is an additional IV fluids he could  accommodate that as well. His primary do not think she needs ongoing IV fluids. However, we will leave her Hep-Lock in place and she will be reevaluated by their staff either NP her PA tomorrow. Was given one dose of fosfomycin. This may be needed. For her UTI, as her last UTI 3:15 was pansensitive Escherichia coli. Plan will be discharge back to the facility. Stop Colace. Follow-up culture. Test of cure, Lomotil when necessary.    Rolland Porter, MD 01/14/16 1534

## 2016-01-16 ENCOUNTER — Telehealth: Payer: Self-pay | Admitting: Emergency Medicine

## 2016-01-16 ENCOUNTER — Non-Acute Institutional Stay (SKILLED_NURSING_FACILITY): Payer: Medicare Other | Admitting: Adult Health

## 2016-01-16 ENCOUNTER — Encounter: Payer: Self-pay | Admitting: Adult Health

## 2016-01-16 DIAGNOSIS — R3915 Urgency of urination: Secondary | ICD-10-CM | POA: Diagnosis not present

## 2016-01-16 DIAGNOSIS — K5289 Other specified noninfective gastroenteritis and colitis: Secondary | ICD-10-CM

## 2016-01-16 LAB — URINE CULTURE: CULTURE: NO GROWTH

## 2016-01-16 NOTE — Progress Notes (Signed)
Patient ID: [name], female   DOB: 1915/01/17, 82 y.o.   MRN: [id number]  Location:  [facility] [employer] of Service:  SNF (31) Provider:   Peggye Ley, ANP [facility] [phone]   REED, Elmarie Shiley, DO  Patient Care Team: Kermit Balo, DO as PCP - General (Geriatric Medicine) [facility]  Extended Emergency Contact Information Primary Emergency Contact: Alcide Evener [place] of [place] Home Phone: [phone] Mobile Phone: [phone] Relation: Other Secondary Emergency Contact: Manson Passey [place] of [place] Home Phone: [phone] Work Phone: [phone] Relation: Nephew  Code Status:  DNR Goals of care: Advanced Directive information Advanced Directives 01/14/2016  Does patient have an advance directive? Yes  Type of Estate agent of [place];Out of facility DNR (pink MOST or yellow form)  Does patient want to make changes to advanced directive? No - Patient declined  Copy of advanced directive(s) in chart? -  Pre-existing out of facility DNR order (yellow form or pink MOST form) -     Chief Complaint  Patient presents with  . Acute Visit    diarrhea    HPI:  Pt is a 82 y.o. female seen today for an acute visit for diarrhea and urinary urgency. She has had diarrhea for the past 3 days. She had 5 loose stools overnight and 2 loose stools today. Family requested that she be sent to the ED on 4/1. A C. Diff culture was obtained in ED which was negative. She has 2 specimens for C.Diff obtained today which are pending. She was given a 2 day course of Pyridium in ED which she has finished. She denies dysuria and hesitancy. She was also given Lomotil in ED. Her last dose of Lomotil was at 7am. A CBC and BMP was also drawn in the ED which was within range. She reports that she ate a tuna fish sandwich without difficulty.  Of note she was treated for a UTI and fall two weeks ago,  which led to her stay in a respite room, previously she lived independently.   Past Medical History  Diagnosis Date  . Acquired deformity of chest and rib 05/28/2007  . Lumbago 02/19/2007  . Rectal prolapse 07/10/2006  . Primary localized osteoarthrosis, unspecified site 06/05/2006  . Scoliosis (and kyphoscoliosis), idiopathic 05/14/2006  . Osteoporosis, unspecified 03/25/2006  . Edema 03/25/2006  . Fracture of neck of femur (HCC) 03/19/2006  . Seborrhea 10/04/2004  . Torticollis, unspecified 10/04/2004  . Pain in limb 12/02/2001  . Chest pain, unspecified 12/02/2001  . Unspecified urinary incontinence 02/28/2000  . Internal hemorrhoids without mention of complication 02/13/1999  . Cough 08/24/1998   Past Surgical History  Procedure Laterality Date  . Colonoscopy  2000  . Botox injection  12/2005    right neck torticollis/cervical dystonia  . Appendectomy    . Hemorrhoid surgery    . Cataract extraction      No Known Allergies    Medication List       This list is accurate as of: 01/16/16  4:47 PM.  Always use your most recent med list.               acetaminophen 650 MG CR tablet  Commonly known as:  TYLENOL  Take 650 mg by mouth every 6 (six) hours as needed for pain.     COUGH DROPS MENTHOL MT  Use as directed 1 lozenge in the mouth or throat every 2 (two) hours as needed (  sore throat).     diphenoxylate-atropine 2.5-0.025 MG tablet  Commonly known as:  LOMOTIL  Take 1 tablet by mouth 4 (four) times daily as needed for diarrhea or loose stools.     phenazopyridine 200 MG tablet  Commonly known as:  PYRIDIUM  Take 1 tablet (200 mg total) by mouth 3 (three) times daily.        Review of Systems  Constitutional: Negative for fever, chills and appetite change.  Respiratory: Negative for cough.   Cardiovascular: Negative for chest pain.  Gastrointestinal: Positive for diarrhea. Negative for vomiting and abdominal pain.  Genitourinary: Negative for dysuria, frequency,  difficulty urinating and pelvic pain.  Neurological: Negative for dizziness, weakness and light-headedness.    Immunization History  Administered Date(s) Administered  . Influenza Whole 08/14/2013  . Influenza-Unspecified 07/29/2014, 08/04/2015   Pertinent  Health Maintenance Due  Topic Date Due  . DEXA SCAN  08/27/1982  . PNA vac Low Risk Adult (1 of 2 - PCV13) 08/27/1982  . INFLUENZA VACCINE  05/15/2016   No flowsheet data found. Functional Status Survey:    Filed Vitals:   01/16/16 1645  BP: 118/79  Pulse: 72  Temp: 97.8 F (36.6 C)  Resp: 18  Weight: 88 lb (39.917 kg)  SpO2: 95%   Body mass index is 17.48 kg/(m^2). Physical Exam  Constitutional: No distress.  HENT:  Mouth/Throat: Oropharynx is clear and moist.  Cardiovascular: Normal rate, regular rhythm and normal heart sounds.   Pulmonary/Chest: Effort normal and breath sounds normal. No respiratory distress. She has no wheezes.  Mild expiratory rales in lower lobes bilat  Abdominal: Soft. Bowel sounds are normal. There is no tenderness. There is no guarding.  Protrusion in LUQ. Area is non-tender.   Neurological: She is alert.  Skin: Skin is warm and dry. She is not diaphoretic.  Psychiatric: She has a normal mood and affect.    Labs reviewed:  Recent Labs  12/28/15 0647 01/14/16 1150  NA 139 140  K 3.8 4.2  CL 98* 104  CO2 31 29  GLUCOSE 97 95  BUN 20 24*  CREATININE 0.64 0.62  CALCIUM 8.5* 8.2*   No results for input(s): AST, ALT, ALKPHOS, BILITOT, PROT, ALBUMIN in the last 8760 hours.  Recent Labs  12/28/15 0647 01/14/16 1150  WBC 5.0 7.7  NEUTROABS  --  7.1  HGB 13.5 11.2*  HCT 40.6 34.7*  MCV 99.0 99.7  PLT 274 283   No results found for: TSH No results found for: HGBA1C No results found for: CHOL, HDL, LDLCALC, LDLDIRECT, TRIG, CHOLHDL  Significant Diagnostic Results in last 30 days:  Dg Chest 2 View  12/28/2015  CLINICAL DATA:  Fall with mid back pain.  Initial encounter.  EXAM: CHEST  2 VIEW COMPARISON:  10/23/2013 FINDINGS: New but chronic appearing fractures of the posterior left second, third, fourth, and fifth ribs. 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 No acute osseous finding. Chronic hyperinflation. There is no edema, consolidation, effusion, or pneumothorax. Normal heart size and mediastinal contours. IMPRESSION: 1. No acute finding. 2. Left rib and clavicle fractures have occurred since 2015 but appear chronic. Electronically Signed   By: Marnee Spring M.D.   On: 12/28/2015 06:38   Dg Forearm Right  12/28/2015  CLINICAL DATA:  Fall with skin tear on the proximal and lateral right elbow. Initial encounter. EXAM: RIGHT FOREARM - 2 VIEW COMPARISON:  None. FINDINGS: No evidence of fracture or opaque foreign body.  Osteopenia. Degenerative narrowing at the elbow, radiocarpal joint, and thumb base. IMPRESSION: No acute finding. Electronically Signed   By: Marnee SpringJonathon  Watts M.D.   On: 12/28/2015 06:30   Ct Head Wo Contrast  12/28/2015  CLINICAL DATA:  Larey SeatFell this morning, does not recall event. EXAM: CT HEAD WITHOUT CONTRAST TECHNIQUE: Contiguous axial images were obtained from the base of the skull through the vertex without intravenous contrast. COMPARISON:  None. FINDINGS: The ventricles and sulci are normal for age. No intraparenchymal hemorrhage, mass effect nor midline shift. Confluent supratentorial white matter hypodensities. No acute large vascular territory infarcts. No abnormal extra-axial fluid collections. Basal cisterns are patent. Moderate calcific atherosclerosis of the carotid siphons. No skull fracture. The included ocular globes and orbital contents are non-suspicious. Status post bilateral ocular lens implants. Minimal paranasal sinus mucosal thickening. Mastoid air cells are well aerated. IMPRESSION: No acute intracranial process. Involutional changes. Moderate to severe chronic small vessel ischemic disease. Electronically  Signed   By: Awilda Metroourtnay  Bloomer M.D.   On: 12/28/2015 06:42    Assessment/Plan 1. Other noninfectious gastroenteritis C. Diff specimens x 2 pending.  Negative C. Diff specimen obtained in ED on 4/1.  Diet as tolerated Encourage fluids Contact precautions  2. Urinary urgency Two day course of Pyridium complete.  Symptoms resolved. No growth in urine culture.     Family/ staff  Communication: discussed with staff Labs/tests ordered:  BMP

## 2016-01-26 ENCOUNTER — Encounter: Payer: Self-pay | Admitting: Adult Health

## 2016-01-26 ENCOUNTER — Non-Acute Institutional Stay (SKILLED_NURSING_FACILITY): Payer: Medicare Other | Admitting: Adult Health

## 2016-01-26 DIAGNOSIS — R3915 Urgency of urination: Secondary | ICD-10-CM | POA: Diagnosis not present

## 2016-01-26 DIAGNOSIS — N3946 Mixed incontinence: Secondary | ICD-10-CM | POA: Insufficient documentation

## 2016-01-26 DIAGNOSIS — Z7189 Other specified counseling: Secondary | ICD-10-CM | POA: Diagnosis not present

## 2016-01-26 NOTE — Progress Notes (Signed)
Patient ID: [name], female   DOB: Nov 07, 1914, 82 y.o.   MRN: [id number]  Location:  [facility] [employer] of Service:  SNF (31) Provider:   Peggye Ley, ANP [facility] [phone]   REED, Elmarie Shiley, DO  Patient Care Team: Kermit Balo, DO as PCP - General (Geriatric Medicine) [facility]  Extended Emergency Contact Information Primary Emergency Contact: Alcide Evener [place] of [place] Home Phone: [phone] Mobile Phone: [phone] Relation: Other Secondary Emergency Contact: Manson Passey [place] of [place] Home Phone: [phone] Work Phone: [phone] Relation: Nephew  Code Status:  DNR Goals of care: Advanced Directive information Advanced Directives 01/14/2016  Does patient have an advance directive? Yes  Type of Estate agent of [place];Out of facility DNR (pink MOST or yellow form)  Does patient want to make changes to advanced directive? No - Patient declined  Copy of advanced directive(s) in chart? -  Pre-existing out of facility DNR order (yellow form or pink MOST form) -     Chief Complaint  Patient presents with  . Acute Visit    frequency, go over most form    HPI:  Pt is a 82 y.o. female seen today for an acute visit for urgency/frequency.  She reports that this has been present for 40+ years. She reports that she goes to the [facility] 5x night. She has not had any bloody urine, painful urination, fever, back pain, etc. She was seen in the ER for a gastroenteritis and a urine was collected there which returned normal (01/14/16). She also would like to go over her care plan in regard to end of life decision making.    Past Medical History  Diagnosis Date  . Acquired deformity of chest and rib 05/28/2007  . Lumbago 02/19/2007  . Rectal prolapse 07/10/2006  . Primary localized osteoarthrosis, unspecified site 06/05/2006  . Scoliosis (and kyphoscoliosis), idiopathic  05/14/2006  . Osteoporosis, unspecified 03/25/2006  . Edema 03/25/2006  . Fracture of neck of femur (HCC) 03/19/2006  . Seborrhea 10/04/2004  . Torticollis, unspecified 10/04/2004  . Pain in limb 12/02/2001  . Chest pain, unspecified 12/02/2001  . Unspecified urinary incontinence 02/28/2000  . Internal hemorrhoids without mention of complication 02/13/1999  . Cough 08/24/1998   Past Surgical History  Procedure Laterality Date  . Colonoscopy  2000  . Botox injection  12/2005    right neck torticollis/cervical dystonia  . Appendectomy    . Hemorrhoid surgery    . Cataract extraction      No Known Allergies    Medication List       This list is accurate as of: 01/26/16  2:34 PM.  Always use your most recent med list.               acetaminophen 650 MG CR tablet  Commonly known as:  TYLENOL  Take 650 mg by mouth every 6 (six) hours as needed for pain.     COUGH DROPS MENTHOL MT  Use as directed 1 lozenge in the mouth or throat every 2 (two) hours as needed (sore throat).     diphenoxylate-atropine 2.5-0.025 MG tablet  Commonly known as:  LOMOTIL  Take 1 tablet by mouth 4 (four) times daily as needed for diarrhea or loose stools.        Review of Systems  Immunization History  Administered Date(s) Administered  . Influenza Whole 08/14/2013  . Influenza-Unspecified 07/29/2014, 08/04/2015   Pertinent  Health Maintenance Due  Topic Date Due  . DEXA SCAN  08/27/1982  . PNA vac Low Risk Adult (1 of 2 - PCV13) 08/27/1982  . INFLUENZA VACCINE  05/15/2016   No flowsheet data found. Functional Status Survey:    Filed Vitals:   01/26/16 1425  BP: 124/61  Pulse: 80  Temp: 97.1 F (36.2 C)  Resp: 18  SpO2: 96%   There is no weight on file to calculate BMI. Physical Exam  Constitutional: She is oriented to person, place, and time. No distress.  Cardiovascular: Normal rate and regular rhythm.   Trace bilat edema  Pulmonary/Chest: Effort normal and breath sounds normal.   Abdominal: Soft. Bowel sounds are normal. There is no tenderness (not tender to the cva area or s/p area).  Protrusion at the left upper quad, not tender, possible hernia, has been present for years per resident, she does not want this looked into  Neurological: She is alert and oriented to person, place, and time.  Skin: Skin is warm and dry. She is not diaphoretic.  Psychiatric: She has a normal mood and affect.    Labs reviewed:  Recent Labs  12/28/15 0647 01/14/16 1150  NA 139 140  K 3.8 4.2  CL 98* 104  CO2 31 29  GLUCOSE 97 95  BUN 20 24*  CREATININE 0.64 0.62  CALCIUM 8.5* 8.2*   No results for input(s): AST, ALT, ALKPHOS, BILITOT, PROT, ALBUMIN in the last 8760 hours.  Recent Labs  12/28/15 0647 01/14/16 1150  WBC 5.0 7.7  NEUTROABS  --  7.1  HGB 13.5 11.2*  HCT 40.6 34.7*  MCV 99.0 99.7  PLT 274 283   No results found for: TSH No results found for: HGBA1C No results found for: CHOL, HDL, LDLCALC, LDLDIRECT, TRIG, CHOLHDL  Significant Diagnostic Results in last 30 days:  Dg Chest 2 View  12/28/2015  CLINICAL DATA:  Fall with mid back pain.  Initial encounter. EXAM: CHEST  2 VIEW COMPARISON:  10/23/2013 FINDINGS: New but chronic appearing fractures of the posterior left second, third, fourth, and fifth ribs. 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 No acute osseous finding. Chronic hyperinflation. There is no edema, consolidation, effusion, or pneumothorax. Normal heart size and mediastinal contours. IMPRESSION: 1. No acute finding. 2. Left rib and clavicle fractures have occurred since 2015 but appear chronic. Electronically Signed   By: Marnee Spring M.D.   On: 12/28/2015 06:38   Dg Forearm Right  12/28/2015  CLINICAL DATA:  Fall with skin tear on the proximal and lateral right elbow. Initial encounter. EXAM: RIGHT FOREARM - 2 VIEW COMPARISON:  None. FINDINGS: No evidence of fracture or opaque foreign body. Osteopenia.  Degenerative narrowing at the elbow, radiocarpal joint, and thumb base. IMPRESSION: No acute finding. Electronically Signed   By: Marnee Spring M.D.   On: 12/28/2015 06:30   Ct Head Wo Contrast  12/28/2015  CLINICAL DATA:  Larey Seat this morning, does not recall event. EXAM: CT HEAD WITHOUT CONTRAST TECHNIQUE: Contiguous axial images were obtained from the base of the skull through the vertex without intravenous contrast. COMPARISON:  None. FINDINGS: The ventricles and sulci are normal for age. No intraparenchymal hemorrhage, mass effect nor midline shift. Confluent supratentorial white matter hypodensities. No acute large vascular territory infarcts. No abnormal extra-axial fluid collections. Basal cisterns are patent. Moderate calcific atherosclerosis of the carotid siphons. No skull fracture. The included ocular globes and orbital contents are non-suspicious. Status post bilateral ocular lens implants. Minimal  paranasal sinus mucosal thickening. Mastoid air cells are well aerated. IMPRESSION: No acute intracranial process. Involutional changes. Moderate to severe chronic small vessel ischemic disease. Electronically Signed   By: Awilda Metroourtnay  Bloomer M.D.   On: 12/28/2015 06:42    Assessment/Plan  1. Urinary urgency -long term issue, may have overactive bladder -no fever, back pain, lower abd pain, purulent urin -recent urine on 4/1 negative -start myrbetriq 25 mg qhs  2. Advanced care planning/counseling discussion -I discussed her wishes for end of life. She would like to be a DNR.  She verbalizes wishes to die without extreme measures. She did not appear depressed, rather, she is resolute and realistic.  We have written for no hospitalizations, IVF for a short trial only, antibiotics on a limited basis, and no tube feeding. -She is alert and oriented, able to make her own decisions.   Family/ staff Communication: staff/resident  Labs/tests ordered:  NA

## 2016-02-28 ENCOUNTER — Non-Acute Institutional Stay: Payer: Medicare Other | Admitting: Internal Medicine

## 2016-02-28 ENCOUNTER — Encounter: Payer: Self-pay | Admitting: Internal Medicine

## 2016-02-28 DIAGNOSIS — R1310 Dysphagia, unspecified: Secondary | ICD-10-CM

## 2016-02-28 DIAGNOSIS — R54 Age-related physical debility: Secondary | ICD-10-CM | POA: Diagnosis not present

## 2016-02-28 DIAGNOSIS — I872 Venous insufficiency (chronic) (peripheral): Secondary | ICD-10-CM | POA: Diagnosis not present

## 2016-02-28 DIAGNOSIS — R059 Cough, unspecified: Secondary | ICD-10-CM

## 2016-02-28 DIAGNOSIS — R05 Cough: Secondary | ICD-10-CM | POA: Diagnosis not present

## 2016-02-28 NOTE — Progress Notes (Signed)
Location:  Medical illustrator of Service:  ALF (13) Provider:  Melissa Pulido L. Renato Gails, D.O., C.M.D.  Bufford Spikes, DO  Patient Care Team: Kermit Balo, DO as PCP - General (Geriatric Medicine) [facility]  Extended Emergency Contact Information Primary Emergency Contact: Alcide Evener [place] Home Phone: [phone] Mobile Phone: [phone] Relation: Other Secondary Emergency Contact: Manson Passey [place] Home Phone: [phone] Work Phone: [phone] Relation: Nephew  Code Status:  DNR Goals of care: Advanced Directive information Advanced Directives 01/14/2016  Does patient have an advance directive? Yes  Type of Estate agent of [place];Out of facility DNR (pink MOST or yellow form)  Does patient want to make changes to advanced directive? No - Patient declined  Copy of advanced directive(s) in chart? -  Pre-existing out of facility DNR order (yellow form or pink MOST form) -     Chief Complaint  Patient presents with  . Acute Visit    hospital bed needed b/c bed too high    HPI:  Pt is a 82 y.o. female seen today for an acute visit for getting a hospital bed.  She does have some dysphagia and requires a bed with the ability to elevate the head of her bed to prevent aspiration.  She also is quite frail and weak so she needs a bed that can be lowered for her to get in it more easily.  She has edema in her legs and needs to be able to elevate her feet to help with this problem.    Past Medical History  Diagnosis Date  . Acquired deformity of chest and rib 05/28/2007  . Lumbago 02/19/2007  . Rectal prolapse 07/10/2006  . Primary localized osteoarthrosis, unspecified site 06/05/2006  . Scoliosis (and kyphoscoliosis), idiopathic 05/14/2006  . Osteoporosis, unspecified 03/25/2006  . Edema 03/25/2006  . Fracture of neck of femur (HCC) 03/19/2006  . Seborrhea 10/04/2004  .  Torticollis, unspecified 10/04/2004  . Pain in limb 12/02/2001  . Chest pain, unspecified 12/02/2001  . Unspecified urinary incontinence 02/28/2000  . Internal hemorrhoids without mention of complication 02/13/1999  . Cough 08/24/1998   Past Surgical History  Procedure Laterality Date  . Colonoscopy  2000  . Botox injection  12/2005    right neck torticollis/cervical dystonia  . Appendectomy    . Hemorrhoid surgery    . Cataract extraction      No Known Allergies    Medication List       This list is accurate as of: 02/28/16  5:00 PM.  Always use your most recent med list.               acetaminophen 650 MG CR tablet  Commonly known as:  TYLENOL  Take 650 mg by mouth every 6 (six) hours as needed for pain.     COUGH DROPS MENTHOL MT  Use as directed 1 lozenge in the mouth or throat every 2 (two) hours as needed (sore throat).     diphenoxylate-atropine 2.5-0.025 MG tablet  Commonly known as:  LOMOTIL  Take 1 tablet by mouth 4 (four) times daily as needed for diarrhea or loose stools.        Review of Systems  Constitutional: Positive for malaise/fatigue. Negative for fever and chills.  HENT: Negative for congestion.   Respiratory: Positive for cough. Negative for shortness of breath.   Cardiovascular: Positive for leg swelling. Negative for chest pain.  Gastrointestinal: Negative for abdominal  pain.       Dysphagia  Genitourinary: Positive for urgency and frequency. Negative for dysuria.       Incontinence chronic  Musculoskeletal: Positive for back pain.  Skin: Negative for rash.  Neurological: Positive for weakness. Negative for dizziness and loss of consciousness.  Psychiatric/Behavioral: Negative for depression.    Immunization History  Administered Date(s) Administered  . Influenza Whole 08/14/2013  . Influenza-Unspecified 07/29/2014, 08/04/2015   Pertinent  Health Maintenance Due  Topic Date Due  . DEXA SCAN  08/27/1982  . PNA vac Low Risk Adult (1 of  2 - PCV13) 08/27/1982  . INFLUENZA VACCINE  05/15/2016   No flowsheet data found. Functional Status Survey: Is the patient deaf or have difficulty hearing?: No Does the patient have difficulty seeing, even when wearing glasses/contacts?: No Does the patient have difficulty concentrating, remembering, or making decisions?: Yes Does the patient have difficulty walking or climbing stairs?: Yes Does the patient have difficulty dressing or bathing?: Yes Does the patient have difficulty doing errands alone such as visiting a doctor's office or shopping?: Yes  Filed Vitals:   02/28/16 1659  BP: 102/59  Pulse: 75  Temp: 98.2 F (36.8 C)  Resp: 20  Height: 4\' 10"  (1.473 m)  Weight: 85 lb (38.556 kg)  SpO2: 96%   Body mass index is 17.77 kg/(m^2). Physical Exam  Constitutional: She is oriented to person, place, and time.  Frail white female  Cardiovascular: Normal rate, regular rhythm, normal heart sounds and intact distal pulses.   Nonpitting edema of bilateral LEs  Pulmonary/Chest: Effort normal and breath sounds normal.  Abdominal: Bowel sounds are normal.  Musculoskeletal: Normal range of motion.  kyphososcoliosis  Neurological: She is alert and oriented to person, place, and time.  Skin: Skin is warm and dry.  Psychiatric: She has a normal mood and affect.    Labs reviewed:  Recent Labs  12/28/15 0647 01/14/16 1150  NA 139 140  K 3.8 4.2  CL 98* 104  CO2 31 29  GLUCOSE 97 95  BUN 20 24*  CREATININE 0.64 0.62  CALCIUM 8.5* 8.2*   No results for input(s): AST, ALT, ALKPHOS, BILITOT, PROT, ALBUMIN in the last 8760 hours.  Recent Labs  12/28/15 0647 01/14/16 1150  WBC 5.0 7.7  NEUTROABS  --  7.1  HGB 13.5 11.2*  HCT 40.6 34.7*  MCV 99.0 99.7  PLT 274 283   Assessment/Plan 1. Dysphagia -at risk for aspiration so need hospital bed to elevated HOB 30 degrees  2. Cough -chronic, has had lung mass on imaging, likely aspirating  3. Chronic venous  insufficiency -elevate feet at rest--needs hospital bed to help with this  4. Frailty -is at risk for developing pressure ulcers due to light weight and frail state -hospital bed will help allow for better repositioning more often and also will need to be able to be lowered so she can easily get into the bed safely  Family/ staff Communication: discussed with nursing  Labs/tests ordered:  Hospital bed due to dysphagia, aspiration risk, chronic venous insufficiency and frailty  Nury Nebergall L. Jazmaine Fuelling, D.O. Geriatrics [facility] [address] [place], [place] [address] Cell Phone (Mon-Fri 8am-5pm):  [phone] On Call:  [phone] & follow prompts after 5pm & weekends Office Phone:  [phone] Office Fax:  [phone]

## 2016-09-27 ENCOUNTER — Non-Acute Institutional Stay: Payer: Medicare Other | Admitting: Adult Health

## 2016-09-27 ENCOUNTER — Encounter: Payer: Self-pay | Admitting: Adult Health

## 2016-09-27 DIAGNOSIS — M2540 Effusion, unspecified joint: Secondary | ICD-10-CM | POA: Diagnosis not present

## 2016-09-27 NOTE — Progress Notes (Signed)
[facility]   Place of Service:  ALF (13) Provider:   Peggye Leyhristy Lauran Moran, ANP [facility] [phone]   Moran, Jennifer ShileyIFFANY, [name]  Patient Care Team: [name], [name] as PCP - General (Geriatric Medicine) [facility]  Extended Emergency Contact Information Primary Emergency Contact: [name],[name]  [place] Home Phone: [phone] Mobile Phone: [phone] Relation: Other Secondary Emergency Contact: [name],M.[name]  [place] Home Phone: [phone] Work Phone: [phone] Relation: Nephew  Code Status:  DNR Goals of care: Advanced Directive information Advanced Directives 09/27/2016  Does Patient Have a Medical Advance Directive? Yes  Type of Estate agentAdvance Directive Healthcare Power of [place];Living will;Out of facility DNR (pink MOST or yellow form)  Does patient want to make changes to medical advance directive? -  Copy of Healthcare Power of Attorney in Chart? Yes  Pre-existing out of facility DNR order (yellow form or pink MOST form) -     Chief Complaint  Patient presents with  . Acute Visit    right middle finger hurts    HPI:  Pt is a 82 y.o. female seen today for an acute visit for right middle finger pain and swelling present for 1 day. She reports that the pain is unbearable and that she is not able to use her hand well for this reason. She denies any trauma. She denies a hx of gout.    Past Medical History:  Diagnosis Date  . Acquired deformity of chest and rib 05/28/2007  . Chest pain, unspecified 12/02/2001  . Cough 08/24/1998  . Edema 03/25/2006  . Fracture of neck of femur (HCC) 03/19/2006  . Internal hemorrhoids without mention of complication 02/13/1999  . Lumbago 02/19/2007  . Osteoporosis, unspecified 03/25/2006  . Pain in limb 12/02/2001  . Primary localized osteoarthrosis, unspecified site 06/05/2006  . Rectal prolapse 07/10/2006  . Scoliosis (and kyphoscoliosis),  idiopathic 05/14/2006  . Seborrhea 10/04/2004  . Torticollis, unspecified 10/04/2004  . Unspecified urinary incontinence 02/28/2000   Past Surgical History:  Procedure Laterality Date  . APPENDECTOMY    . BOTOX INJECTION  12/2005   right neck torticollis/cervical dystonia  . CATARACT EXTRACTION    . COLONOSCOPY  2000  . HEMORRHOID SURGERY      No Known Allergies    Medication List       Accurate as of 09/27/16 12:05 PM. Always use your most recent med list.          acetaminophen 650 MG CR tablet Commonly known as:  TYLENOL Take 650 mg by mouth every 6 (six) hours as needed for pain.   COUGH DROPS MENTHOL MT Use as directed 1 lozenge in the mouth or throat every 2 (two) hours as needed (sore throat).   diphenoxylate-atropine 2.5-0.025 MG tablet Commonly known as:  LOMOTIL Take 1 tablet by mouth 4 (four) times daily as needed for diarrhea or loose stools.       Review of Systems  Respiratory: Negative for cough, shortness of breath and wheezing.   Cardiovascular: Negative for chest pain, palpitations and leg swelling.  Gastrointestinal: Negative for abdominal distention, abdominal pain, diarrhea and vomiting.  Musculoskeletal: Positive for arthralgias, gait problem and joint swelling.    Immunization History  Administered Date(s) Administered  . Influenza Inj Mdck Quad Pf 08/02/2016  . Influenza Whole 08/14/2013  . Influenza-Unspecified 07/29/2014, 08/04/2015   Pertinent  Health Maintenance Due  Topic Date Due  . DEXA SCAN  08/27/1982  . PNA vac Low  Risk Adult (1 of 2 - PCV13) 08/27/1982  . INFLUENZA VACCINE  Completed   No flowsheet data found. Functional Status Survey:    There were no vitals filed for this visit. There is no height or weight on file to calculate BMI. Physical Exam  Constitutional: No distress.  Thin frail white female  Musculoskeletal: She exhibits edema and tenderness.  Right middle finger with swelling, warmth, tenderness, and  erythema to the PIP joint  Skin: Skin is warm and dry. She is not diaphoretic. There is erythema.    Labs reviewed:  Recent Labs  12/28/15 0647 01/14/16 1150  NA 139 140  K 3.8 4.2  CL 98* 104  CO2 31 29  GLUCOSE 97 95  BUN 20 24*  CREATININE 0.64 0.62  CALCIUM 8.5* 8.2*   No results for input(s): AST, ALT, ALKPHOS, BILITOT, PROT, ALBUMIN in the last 8760 hours.  Recent Labs  12/28/15 0647 01/14/16 1150  WBC 5.0 7.7  NEUTROABS  --  7.1  HGB 13.5 11.2*  HCT 40.6 34.7*  MCV 99.0 99.7  PLT 274 283   No results found for: TSH No results found for: HGBA1C No results found for: CHOL, HDL, LDLCALC, LDLDIRECT, TRIG, CHOLHDL  Significant Diagnostic Results in last 30 days:  No results found.  Assessment/Plan 1. Swollen joint Extremely painful, not able to use her right hand Check uric acid Prednisone 20 mg BID for 5 days  Family/ staff Communication: discussed with resident  Labs/tests ordered: uric acid

## 2017-03-20 ENCOUNTER — Encounter: Payer: Self-pay | Admitting: Internal Medicine

## 2017-03-20 ENCOUNTER — Non-Acute Institutional Stay: Payer: Medicare Other | Admitting: Internal Medicine

## 2017-03-20 VITALS — BP 136/62 | HR 79 | Temp 98.9°F | Ht <= 58 in | Wt 92.0 lb

## 2017-03-20 DIAGNOSIS — R3915 Urgency of urination: Secondary | ICD-10-CM

## 2017-03-20 DIAGNOSIS — M545 Low back pain, unspecified: Secondary | ICD-10-CM

## 2017-03-20 DIAGNOSIS — I872 Venous insufficiency (chronic) (peripheral): Secondary | ICD-10-CM | POA: Diagnosis not present

## 2017-03-20 DIAGNOSIS — G8929 Other chronic pain: Secondary | ICD-10-CM

## 2017-03-20 DIAGNOSIS — R1310 Dysphagia, unspecified: Secondary | ICD-10-CM

## 2017-03-20 DIAGNOSIS — R918 Other nonspecific abnormal finding of lung field: Secondary | ICD-10-CM | POA: Diagnosis not present

## 2017-03-20 DIAGNOSIS — K5901 Slow transit constipation: Secondary | ICD-10-CM

## 2017-03-20 NOTE — Progress Notes (Signed)
Location:  Medical illustrator of Service:  Clinic (12)  Provider: Evin Chirco L. Renato Moran, D.O., C.M.D.  Code Status: DNR, has MOST, do not hospitalize Goals of Care:  Advanced Directives 03/20/2017  Does Patient Have a Medical Advance Directive? Yes  Type of Advance Directive Out of facility DNR (pink MOST or yellow form);Healthcare Power of [place];Living will  Does patient want to make changes to medical advance directive? -  Copy of Healthcare Power of Attorney in Chart? Yes  Pre-existing out of facility DNR order (yellow form or pink MOST form) Yellow form placed in chart (order not valid for inpatient use);Pink MOST form placed in chart (order not valid for inpatient use)    Chief Complaint  Patient presents with  . Medical Management of Chronic Issues    routine visit    HPI: Patient is a 83 y.o. female seen today for medical management of chronic diseases.  Takes zero medications.  Lives in [place].    She reports doing fine and did not really want to be seen.  Does not want tests done.  She will be 89 in November.   She has a lung mass for several years felt to be likely bronchogenic ca.  She has refused intervention or further eval.  She has actually gained 7 lbs in the past year.    Low back pain:  Not bothersome recently.  Uses her power chair to get around.    Chronic venous insufficiency:  Uses the compression hose--controls it well.    Constipation:  Doing well, no problem.    Urinary urgency:  Not bothering her now either.  No recent infections.    Dysphagia:  Says this is going well, too.    No falls.    Sleep:  Has to get up constantly at night to urinate.  Does go right back to sleep.    Does not want anything done that will prolong her life.  She had a great life and is ready to die whenever that comes.  Past Medical History:  Diagnosis Date  . Acquired deformity of chest and rib 05/28/2007  . Chest pain, unspecified 12/02/2001  . Cough  08/24/1998  . Edema 03/25/2006  . Fracture of neck of femur (HCC) 03/19/2006  . Internal hemorrhoids without mention of complication 02/13/1999  . Lumbago 02/19/2007  . Osteoporosis, unspecified 03/25/2006  . Pain in limb 12/02/2001  . Primary localized osteoarthrosis, unspecified site 06/05/2006  . Rectal prolapse 07/10/2006  . Scoliosis (and kyphoscoliosis), idiopathic 05/14/2006  . Seborrhea 10/04/2004  . Torticollis, unspecified 10/04/2004  . Unspecified urinary incontinence 02/28/2000    Past Surgical History:  Procedure Laterality Date  . APPENDECTOMY    . BOTOX INJECTION  12/2005   right neck torticollis/cervical dystonia  . CATARACT EXTRACTION    . COLONOSCOPY  2000  . HEMORRHOID SURGERY      No Known Allergies  Allergies as of 03/20/2017   No Known Allergies     Medication List    as of 03/20/2017 11:07 AM   You have not been prescribed any medications.     Review of Systems:  Review of Systems  Constitutional: Positive for malaise/fatigue. Negative for chills, fever and weight loss.  HENT: Positive for congestion.        Hoarseness, but refuses interventions  Respiratory: Positive for cough. Negative for shortness of breath.        Congestion  Cardiovascular: Negative for chest pain, palpitations and leg swelling.  Gastrointestinal: Negative for abdominal pain, blood in stool, constipation, diarrhea, heartburn and melena.  Genitourinary: Positive for frequency and urgency. Negative for dysuria, flank pain and hematuria.  Musculoskeletal: Negative for back pain, falls and joint pain.  Neurological: Positive for weakness. Negative for dizziness and loss of consciousness.  Endo/Heme/Allergies: Does not bruise/bleed easily.  Psychiatric/Behavioral: Negative for depression and memory loss. The patient is not nervous/anxious and does not have insomnia.     Health Maintenance  Topic Date Due  . TETANUS/TDAP  08/27/1934  . DEXA SCAN  08/27/1982  . PNA vac Low Risk Adult (1  of 2 - PCV13) 08/27/1982  . INFLUENZA VACCINE  05/15/2017    Physical Exam: Vitals:   03/20/17 1055  BP: 136/62  Pulse: 79  Temp: 98.9 F (37.2 C)  TempSrc: Oral  SpO2: 96%  Weight: 92 lb (41.7 kg)  Height: 4\' 10"  (1.473 m)   Body mass index is 19.23 kg/m. Physical Exam  Constitutional: She is oriented to person, place, and time. She appears well-developed. No distress.  HENT:  Head: Normocephalic and atraumatic.  Cardiovascular: Normal rate, regular rhythm and intact distal pulses.   Murmur heard. Pulmonary/Chest: Effort normal. She has no wheezes. She has no rales.  Coarse rhonchi throughout  Abdominal: Soft. Bowel sounds are normal. She exhibits no distension. There is no tenderness.  Musculoskeletal: Normal range of motion.  Comes in power wheelchair, has green license  Neurological: She is alert and oriented to person, place, and time.  Skin: Skin is warm and dry. There is pallor.  Dry scaly patch at posterior hairline with surrounding erythema, some dry skin flakes on collar  Psychiatric: She has a normal mood and affect.    Labs reviewed: Does not want testing  Assessment/Plan 1. Lung mass -noted in 2015 and felt to be malignant, but she refused workup and was for a while declining with weight loss, but recently has actually gained weight  2. Urinary urgency -ongoing with hs frequency, not new, refused medications  3. Chronic bilateral low back pain without sciatica -not bothersome recently by report (uses power chair to avoid walking and prolonged standing that led to the pain in the past)--pt denies ever having it but I recall her discomfort when she was in rehab the last time  4. Chronic venous insufficiency -stable with use of compression hose  5. Slow transit constipation -denies any problems, not on medications for this  6. Dysphagia, unspecified type -denies any difficulty with this either, but clearly has wet voice, hoarseness, wet cough--on  regular diet with thin liquids and fluids encouraged  Labs/tests ordered:  No new at her request Next appt:  6 mos AWV  [name] L. [name], D.O. Geriatrics [facility] [address] [place], [place] [address] Cell Phone (Mon-Fri 8am-5pm):  [phone] On Call:  [phone] & follow prompts after 5pm & weekends Office Phone:  [phone] Office Fax:  [phone]

## 2017-04-22 IMAGING — CR DG CHEST 2V
3 series · 3 of 3 positions shown · non-contrast
Comparison: 10/23/2013

CLINICAL DATA: Fall with mid back pain.  Initial encounter.

EXAM:
CHEST  2 VIEW

[x chest ap (1 of 2)]
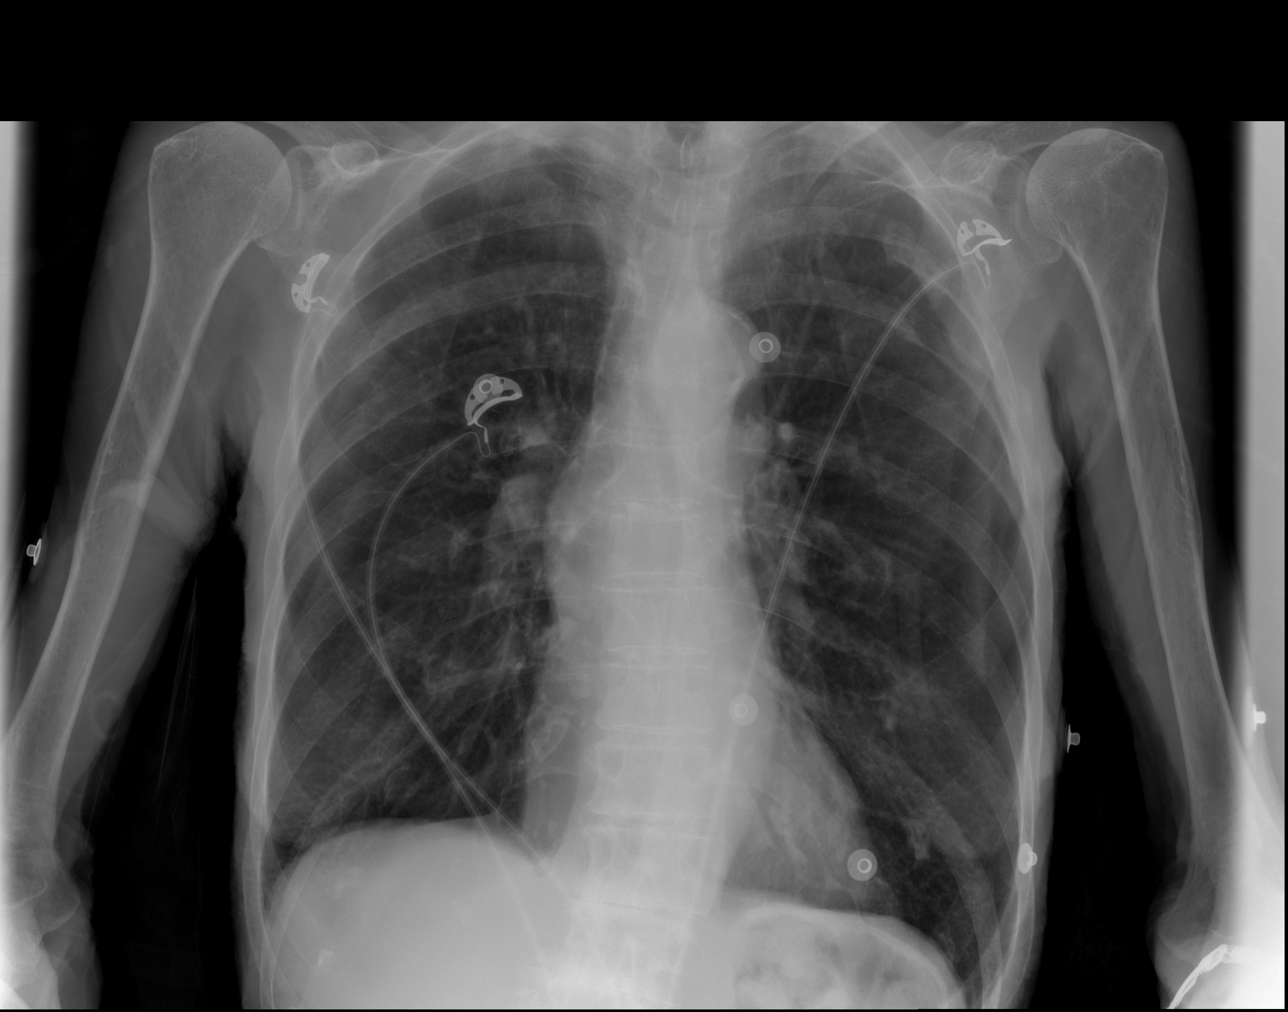

[x chest ap (2 of 2)]
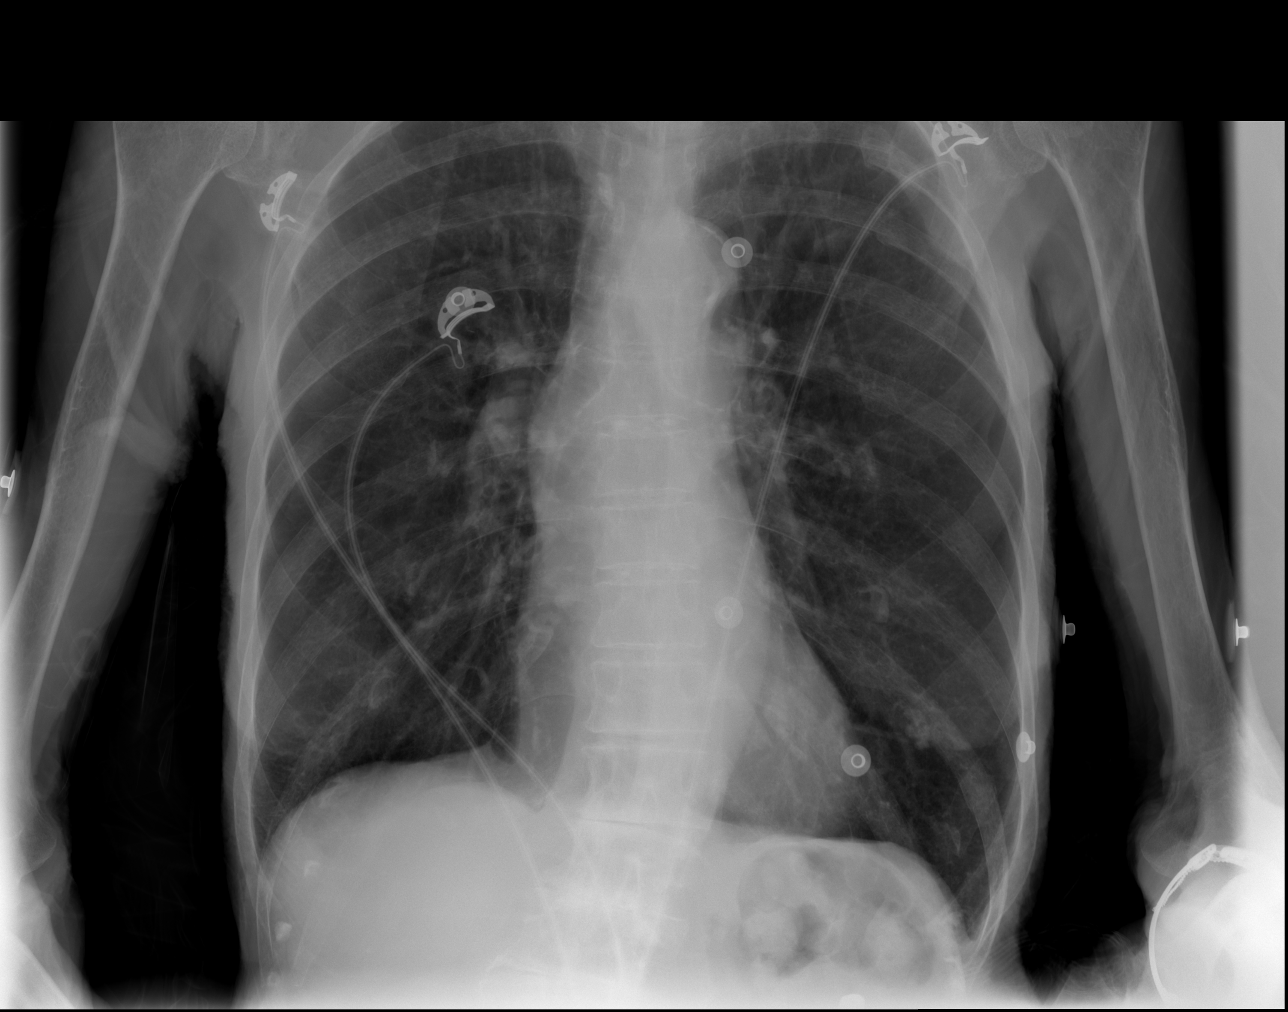

[w chest lat]
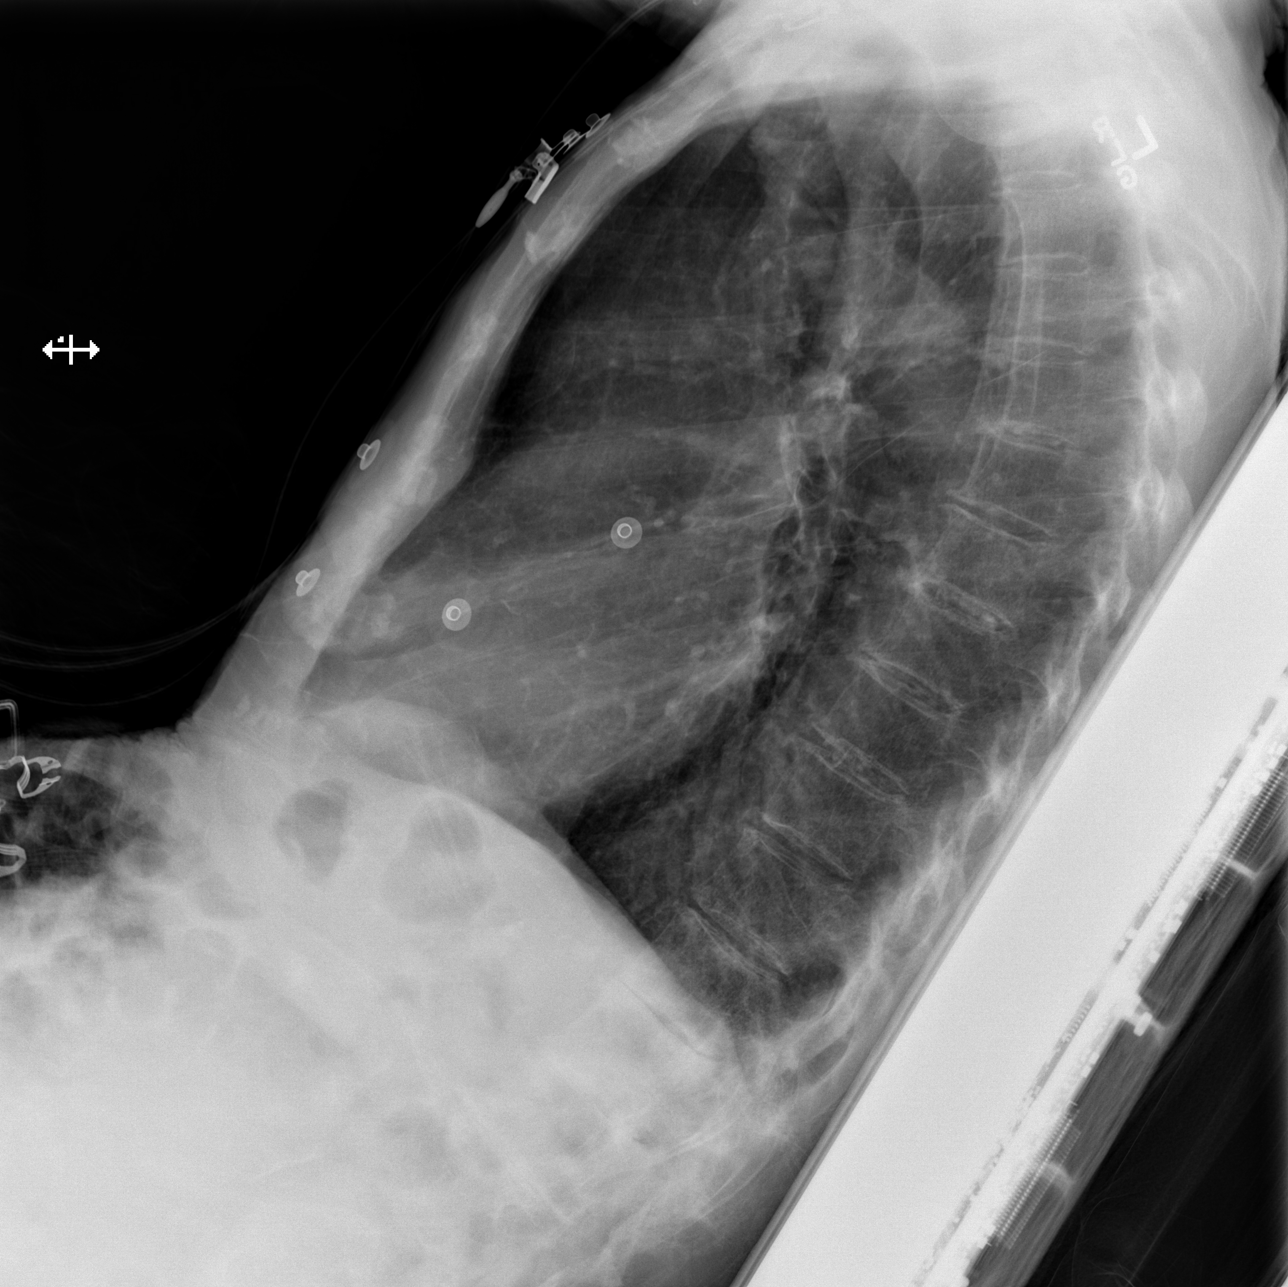

[3 of 3 positions shown; findings below may reference images not displayed]

FINDINGS: New but chronic appearing fractures of the posterior left second,
third, fourth, and fifth ribs. There is also been fracture of the
lateral left clavicle with nonunion. No acute osseous finding.

Chronic hyperinflation. There is no edema, consolidation, effusion,
or pneumothorax. Normal heart size and mediastinal contours.
IMPRESSION: 1. No acute finding.
2. Left rib and clavicle fractures have occurred since 2393 but
appear chronic.

## 2017-04-22 IMAGING — CT CT HEAD W/O CM
2 series · 17 of 30 positions shown, 20 images · non-contrast
Comparison: None.

CLINICAL DATA: Fell this morning, does not recall event.

EXAM:
CT HEAD WITHOUT CONTRAST
TECHNIQUE: Contiguous axial images were obtained from the base of the skull
through the vertex without intravenous contrast.

[Series 2: head w/o · axial · non-contrast · 0.45mm/px · z∈[-105,+25]mm · 9 of 34 slices shown, 12 images]
[im 4/34  brain]
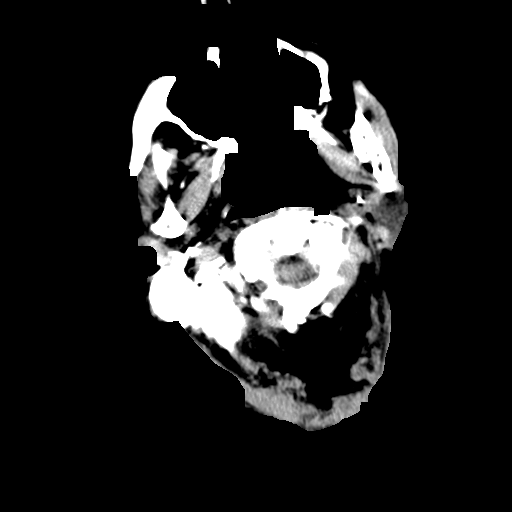
[im 4/34  bone]
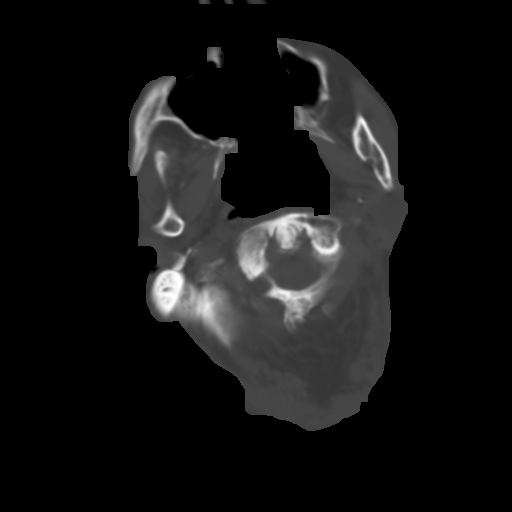
[im 7/34  brain]
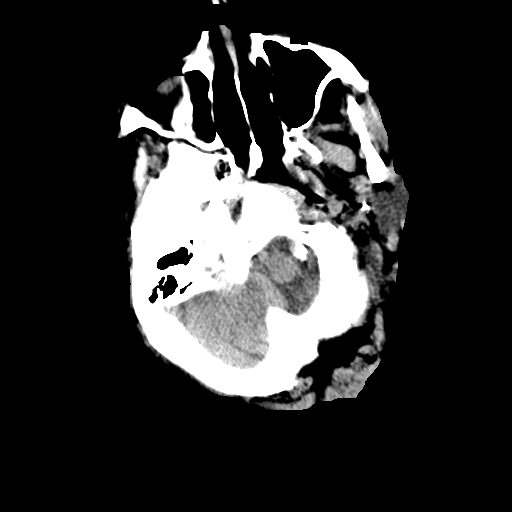
[im 10/34  brain]
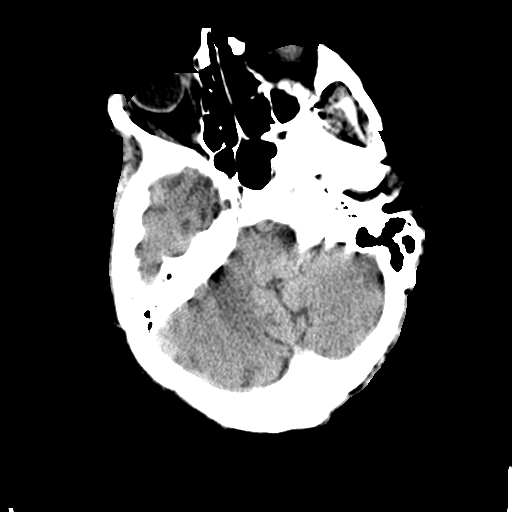
[im 14/34  brain]
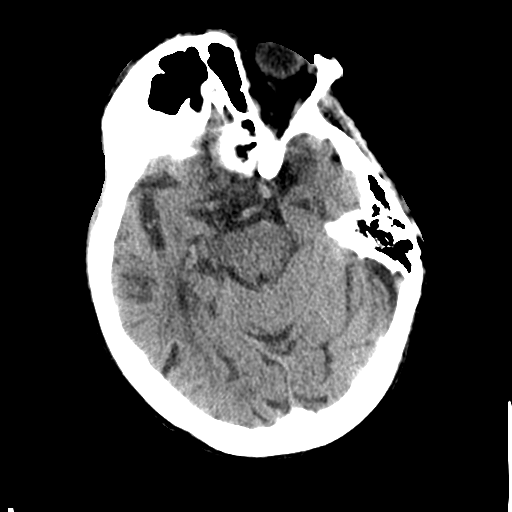
[im 17/34  brain]
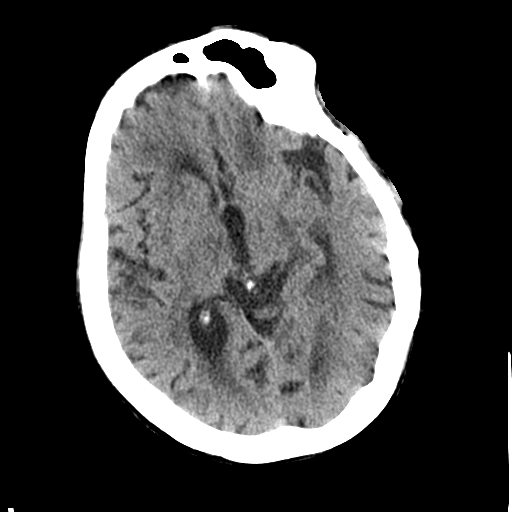
[im 17/34  bone]
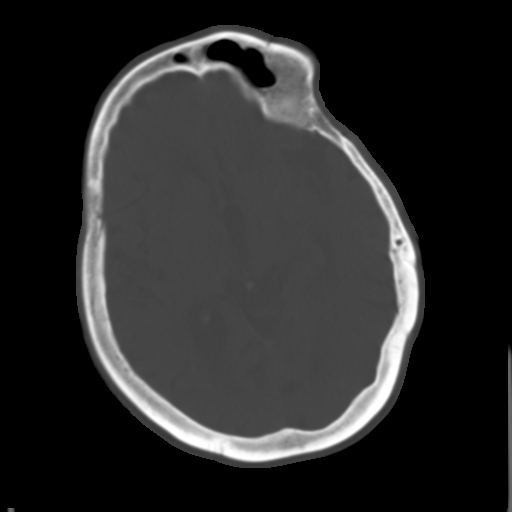
[im 20/34  brain]
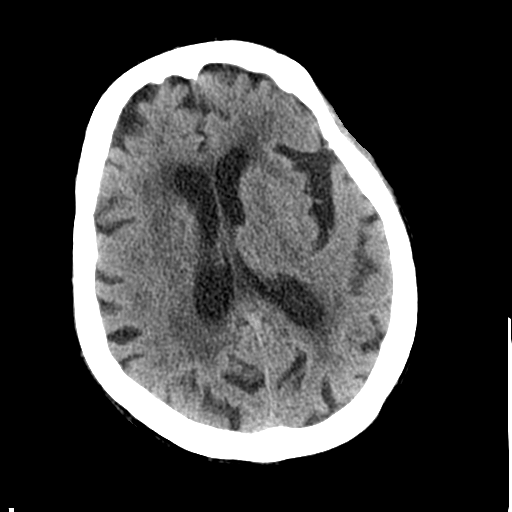
[im 24/34  brain]
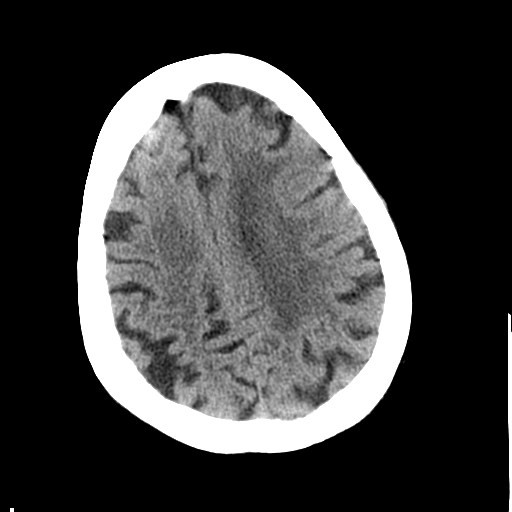
[im 27/34  brain]
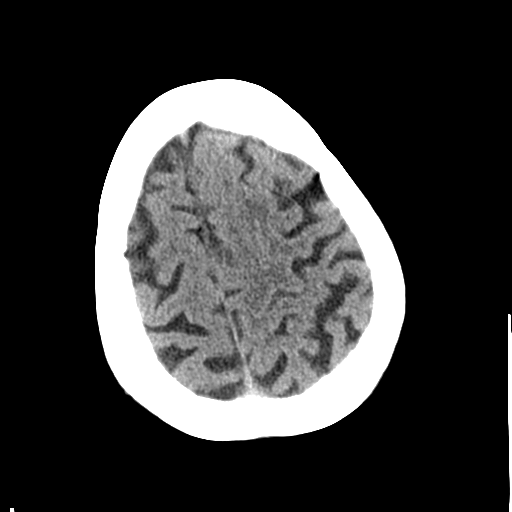
[im 30/34  brain]
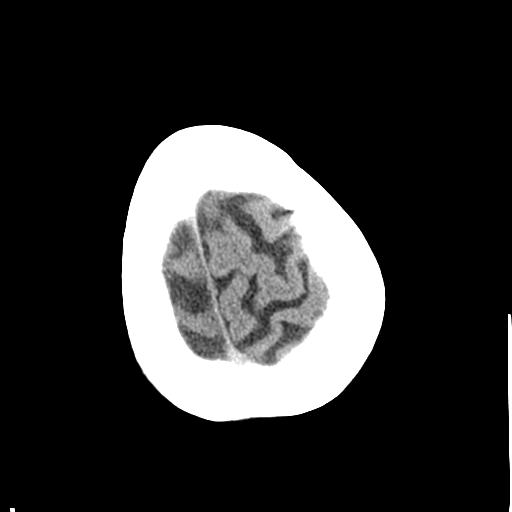
[im 30/34  bone]
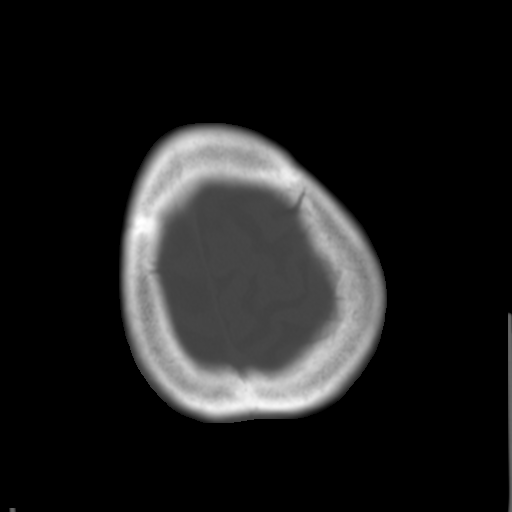

[Series 3: bone windows · axial · 0.45mm/px · z∈[-102,+24]mm · 8 of 56 slices shown]
[im 7/56  bone]
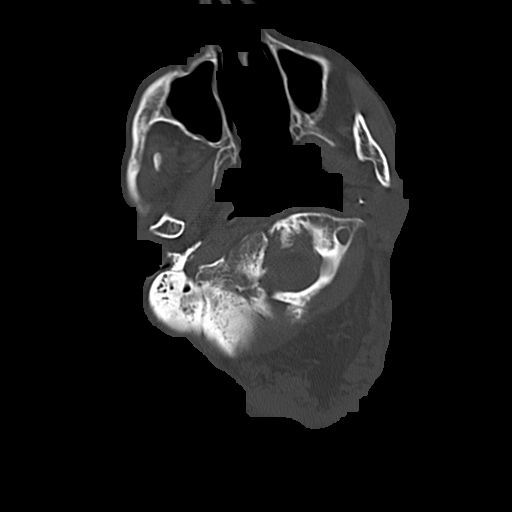
[im 13/56  bone]
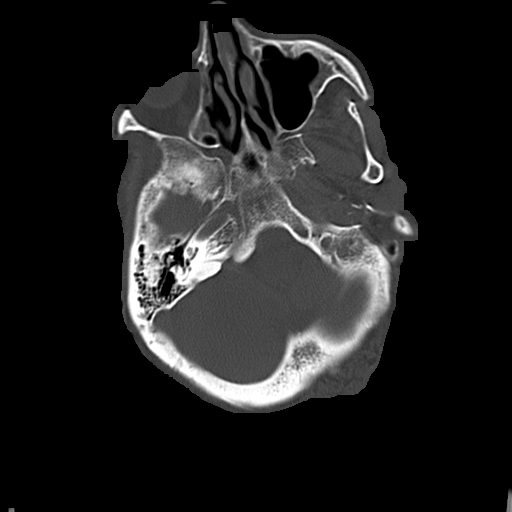
[im 19/56  bone]
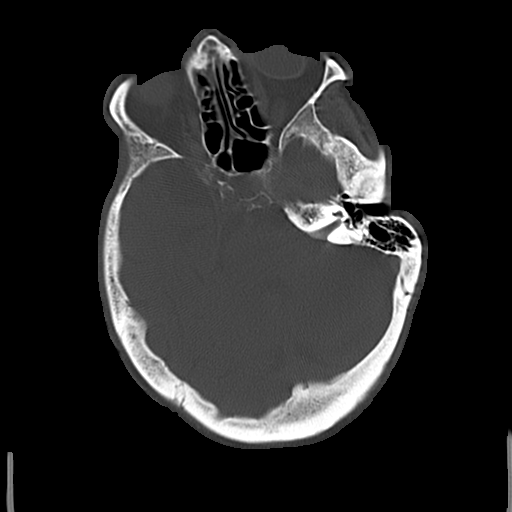
[im 25/56  bone]
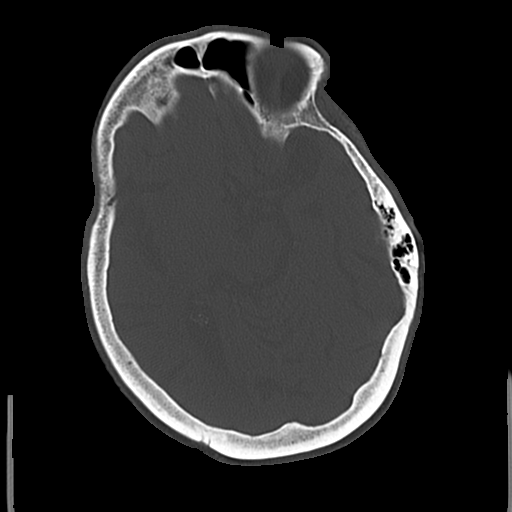
[im 31/56  bone]
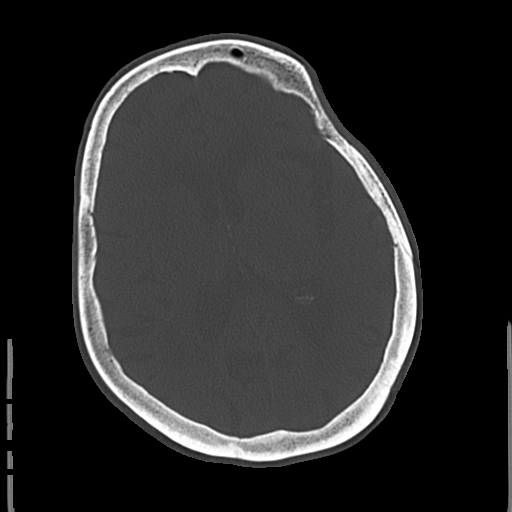
[im 37/56  bone]
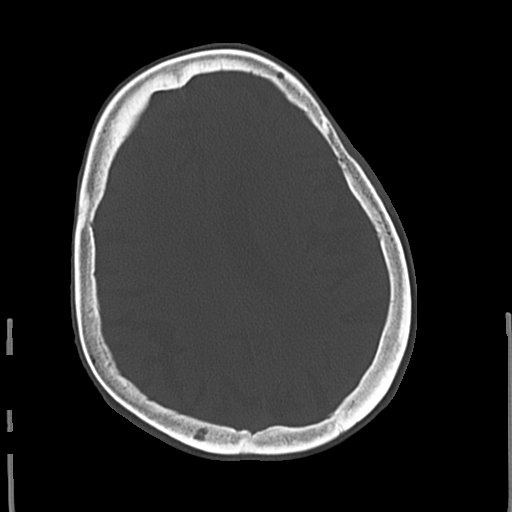
[im 43/56  bone]
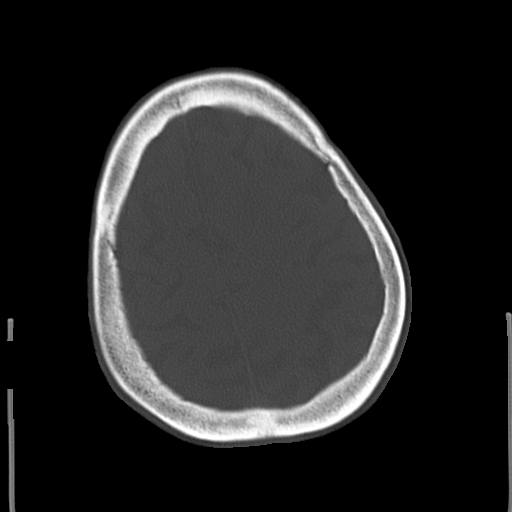
[im 49/56  bone]
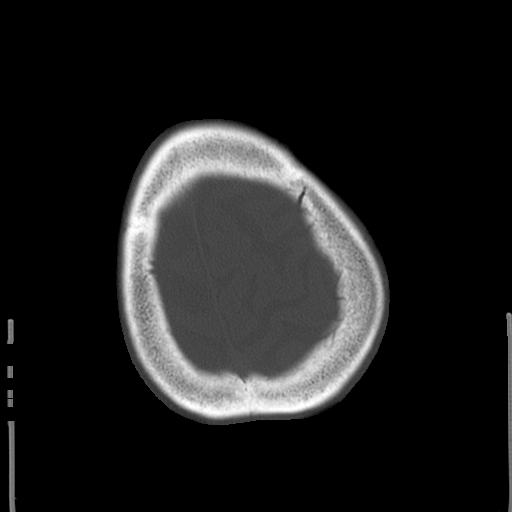

[17 of 30 positions shown; findings below may reference images not displayed]

FINDINGS: The ventricles and sulci are normal for age. No intraparenchymal
hemorrhage, mass effect nor midline shift. Confluent supratentorial
white matter hypodensities. No acute large vascular territory
infarcts.

No abnormal extra-axial fluid collections. Basal cisterns are
patent. Moderate calcific atherosclerosis of the carotid siphons.

No skull fracture. The included ocular globes and orbital contents
are non-suspicious. Status post bilateral ocular lens implants.
Minimal paranasal sinus mucosal thickening. Mastoid air cells are
well aerated.
IMPRESSION: No acute intracranial process.

Involutional changes. Moderate to severe chronic small vessel
ischemic disease.

## 2017-05-28 ENCOUNTER — Emergency Department (HOSPITAL_COMMUNITY): Payer: Medicare Other

## 2017-05-28 ENCOUNTER — Emergency Department (HOSPITAL_COMMUNITY)
Admission: EM | Admit: 2017-05-28 | Discharge: 2017-05-28 | Disposition: A | Discharge status: Home / Self Care | Payer: Medicare Other | Attending: Emergency Medicine | Admitting: Emergency Medicine

## 2017-05-28 ENCOUNTER — Encounter (HOSPITAL_COMMUNITY): Payer: Self-pay | Admitting: Emergency Medicine

## 2017-05-28 DIAGNOSIS — Y9384 Activity, sleeping: Secondary | ICD-10-CM | POA: Insufficient documentation

## 2017-05-28 DIAGNOSIS — Y999 Unspecified external cause status: Secondary | ICD-10-CM | POA: Insufficient documentation

## 2017-05-28 DIAGNOSIS — N390 Urinary tract infection, site not specified: Secondary | ICD-10-CM | POA: Insufficient documentation

## 2017-05-28 DIAGNOSIS — R410 Disorientation, unspecified: Secondary | ICD-10-CM | POA: Diagnosis not present

## 2017-05-28 DIAGNOSIS — R300 Dysuria: Secondary | ICD-10-CM | POA: Diagnosis present

## 2017-05-28 DIAGNOSIS — Y92122 Bedroom in nursing home as the place of occurrence of the external cause: Secondary | ICD-10-CM | POA: Insufficient documentation

## 2017-05-28 DIAGNOSIS — W19XXXA Unspecified fall, initial encounter: Secondary | ICD-10-CM

## 2017-05-28 DIAGNOSIS — W06XXXA Fall from bed, initial encounter: Secondary | ICD-10-CM | POA: Diagnosis not present

## 2017-05-28 LAB — URINALYSIS, MICROSCOPIC (REFLEX)

## 2017-05-28 LAB — CBC WITH DIFFERENTIAL/PLATELET
Basophils Absolute: 0 K/uL (ref 0.0–0.1)
Basophils Relative: 0 %
Eosinophils Absolute: 0 K/uL (ref 0.0–0.7)
Eosinophils Relative: 0 %
HCT: 37.9 % (ref 36.0–46.0)
Hemoglobin: 12.4 g/dL (ref 12.0–15.0)
Lymphocytes Relative: 6 %
Lymphs Abs: 0.7 K/uL (ref 0.7–4.0)
MCH: 32.5 pg (ref 26.0–34.0)
MCHC: 32.7 g/dL (ref 30.0–36.0)
MCV: 99.2 fL (ref 78.0–100.0)
Monocytes Absolute: 1.8 K/uL — ABNORMAL HIGH (ref 0.1–1.0)
Monocytes Relative: 16 %
Neutro Abs: 8.7 K/uL — ABNORMAL HIGH (ref 1.7–7.7)
Neutrophils Relative %: 78 %
Platelets: 195 K/uL (ref 150–400)
RBC: 3.82 MIL/uL — ABNORMAL LOW (ref 3.87–5.11)
RDW: 13.4 % (ref 11.5–15.5)
WBC: 11.1 K/uL — ABNORMAL HIGH (ref 4.0–10.5)

## 2017-05-28 LAB — URINALYSIS, ROUTINE W REFLEX MICROSCOPIC
Bilirubin Urine: NEGATIVE
Glucose, UA: NEGATIVE mg/dL
Ketones, ur: NEGATIVE mg/dL
Nitrite: NEGATIVE
Protein, ur: 100 mg/dL — AB
Specific Gravity, Urine: 1.02 (ref 1.005–1.030)
pH: 6 (ref 5.0–8.0)

## 2017-05-28 LAB — I-STAT TROPONIN, ED: Troponin i, poc: 0.03 ng/mL (ref 0.00–0.08)

## 2017-05-28 LAB — BASIC METABOLIC PANEL WITH GFR
Anion gap: 12 (ref 5–15)
BUN: 21 mg/dL — ABNORMAL HIGH (ref 6–20)
CO2: 29 mmol/L (ref 22–32)
Calcium: 8.9 mg/dL (ref 8.9–10.3)
Chloride: 99 mmol/L — ABNORMAL LOW (ref 101–111)
Creatinine, Ser: 0.72 mg/dL (ref 0.44–1.00)
GFR calc Af Amer: >60 mL/min
GFR calc non Af Amer: >60 mL/min
Glucose, Bld: 121 mg/dL — ABNORMAL HIGH (ref 65–99)
Potassium: 3.3 mmol/L — ABNORMAL LOW (ref 3.5–5.1)
Sodium: 140 mmol/L (ref 135–145)

## 2017-05-28 MED ORDER — CEPHALEXIN 500 MG PO CAPS: 500.0000 mg | ORAL_CAPSULE | Freq: Two times a day (BID) | ORAL | 0 refills | Status: AC

## 2017-05-28 MED ORDER — DEXTROSE 5 % IV SOLN
1.0000 g | Freq: Once | INTRAVENOUS | Status: AC
  Administered 2017-05-28: 1 g via INTRAVENOUS
  Filled 2017-05-28: qty 10

## 2017-05-28 NOTE — ED Notes (Signed)
[facility] called for transport.  

## 2017-05-28 NOTE — ED Provider Notes (Signed)
[facility]-EMERGENCY DEPT Provider Note   CSN: [id number] Arrival date & time: 05/28/17  0813     History   Chief Complaint Chief Complaint  Patient presents with  . Fall    HPI [name] is a 83 y.o. female.  HPI  Patient presents to ED for evaluation of fall, foul-smelling urine and dysuria. Concerning the fall, staff at [facility] nursing home states that patient fell out of bed and was found laying under the bed. She states that she fell because "someone made me fall." She denies any injury from the fall. She denies any headache, vision changes, neck pain. She denies a blood thinner use. Staff also states that patient has had foul-smelling urine. Patient complains of dysuria "on and off for a while." They state that she is more confused than usual. She denies any abdominal pain, nausea, vomiting, diarrhea, constipation, chest pain, trouble breathing, cough, numbness, weakness. She states that she is mobile via motor wheelchair.  Past Medical History:  Diagnosis Date  . Acquired deformity of chest and rib 05/28/2007  . Chest pain, unspecified 12/02/2001  . Cough 08/24/1998  . Edema 03/25/2006  . Fracture of neck of femur (HCC) 03/19/2006  . Internal hemorrhoids without mention of complication 02/13/1999  . Lumbago 02/19/2007  . Osteoporosis, unspecified 03/25/2006  . Pain in limb 12/02/2001  . Primary localized osteoarthrosis, unspecified site 06/05/2006  . Rectal prolapse 07/10/2006  . Scoliosis (and kyphoscoliosis), idiopathic 05/14/2006  . Seborrhea 10/04/2004  . Torticollis, unspecified 10/04/2004  . Unspecified urinary incontinence 02/28/2000    Patient Active Problem List   Diagnosis Date Noted  . Chronic venous insufficiency 02/28/2016  . Urinary urgency 01/26/2016  . Constipation 12/30/2015  . Dysphagia 12/30/2015  . UTI (urinary tract infection) 12/30/2015  . Lung mass 10/23/2013  . Lumbago 02/19/2007  . Torticollis, unspecified 10/04/2004  . Cough 08/24/1998     Past Surgical History:  Procedure Laterality Date  . APPENDECTOMY    . BOTOX INJECTION  12/2005   right neck torticollis/cervical dystonia  . CATARACT EXTRACTION    . COLONOSCOPY  2000  . HEMORRHOID SURGERY      OB History    No data available       Home Medications    Prior to Admission medications   Not on File    Family History Family History  Problem Relation Age of Onset  . Stroke Mother     Social History Social History  Substance Use Topics  . Smoking status: Never Smoker  . Smokeless tobacco: Never Used  . Alcohol use No     Allergies   Patient has no known allergies.   Review of Systems Review of Systems  Constitutional: Negative for appetite change, chills and fever.  HENT: Negative for ear pain, rhinorrhea, sneezing and sore throat.   Eyes: Negative for photophobia and visual disturbance.  Respiratory: Negative for cough, chest tightness, shortness of breath and wheezing.   Cardiovascular: Negative for chest pain and palpitations.  Gastrointestinal: Negative for abdominal pain, blood in stool, constipation, diarrhea, nausea and vomiting.  Genitourinary: Positive for dysuria. Negative for hematuria and urgency.  Musculoskeletal: Negative for myalgias.  Skin: Negative for rash.  Neurological: Negative for dizziness, weakness and light-headedness.     Physical Exam Updated Vital Signs BP (!) 158/67   Pulse 92   Temp 98.6 F (37 C) (Oral)   Resp 20   SpO2 94%   Physical Exam  Constitutional: She is oriented to person, place, and time.  She appears well-developed and well-nourished. No distress.  Alert, oriented to person, place, time (year, month, day). Elderly, frail female. Pleasant. Nontoxic appearing, in no acute distress. No visible signs of trauma. C-collar in place during initial assessment.  HENT:  Head: Normocephalic and atraumatic.  Nose: Nose normal.  Eyes: Pupils are equal, round, and reactive to light. Conjunctivae and EOM  are normal. Right eye exhibits discharge. Left eye exhibits discharge. No scleral icterus.  Bilateral eye dryness.  Neck: Normal range of motion. Neck supple.  Cardiovascular: Normal rate, regular rhythm, normal heart sounds and intact distal pulses.  Exam reveals no gallop and no friction rub.   No murmur heard. Pulmonary/Chest: Effort normal and breath sounds normal. No respiratory distress.  Abdominal: Soft. Bowel sounds are normal. She exhibits no distension. There is no tenderness. There is no guarding.  Musculoskeletal: Normal range of motion. She exhibits no edema.  Neurological: She is alert and oriented to person, place, and time. No cranial nerve deficit or sensory deficit. She exhibits normal muscle tone. Coordination normal.  Pupils reactive. No facial asymmetry noted. Cranial nerves appear grossly intact. Sensation intact to light touch on face, BUE and BLE. Strength 4/5 in BUE and BLE.  Skin: Skin is warm and dry. No rash noted.  Psychiatric: She has a normal mood and affect.  Nursing note and vitals reviewed.    ED Treatments / Results  Labs (all labs ordered are listed, but only abnormal results are displayed) Labs Reviewed  URINALYSIS, ROUTINE W REFLEX MICROSCOPIC - Abnormal; Notable for the following:       Result Value   APPearance TURBID (*)    Hgb urine dipstick LARGE (*)    Protein, ur 100 (*)    Leukocytes, UA LARGE (*)    All other components within normal limits  BASIC METABOLIC PANEL - Abnormal; Notable for the following:    Potassium 3.3 (*)    Chloride 99 (*)    Glucose, Bld 121 (*)    BUN 21 (*)    All other components within normal limits  CBC WITH DIFFERENTIAL/PLATELET - Abnormal; Notable for the following:    WBC 11.1 (*)    RBC 3.82 (*)    Neutro Abs 8.7 (*)    Monocytes Absolute 1.8 (*)    All other components within normal limits  URINALYSIS, MICROSCOPIC (REFLEX) - Abnormal; Notable for the following:    Bacteria, UA MANY (*)    Squamous  Epithelial / LPF 0-5 (*)    All other components within normal limits  I-STAT TROPONIN, ED    EKG  EKG Interpretation  Date/Time:  Tuesday May 28 2017 08:27:51 EDT Ventricular Rate:  90 PR Interval:    QRS Duration: 95 QT Interval:  386 QTC Calculation: 473 R Axis:   76 Text Interpretation:  Sinus rhythm Atrial premature complexes Borderline prolonged PR interval Left ventricular hypertrophy Anterior infarct, old No significant change since last tracing Confirmed by Linwood DibblesKnapp, Jon [phone]([id number]) on 05/28/2017 8:44:14 AM       Radiology Dg Chest 2 View  Result Date: 05/28/2017 CLINICAL DATA:  Fall, altered mental status. EXAM: CHEST  2 VIEW COMPARISON:  Chest x-ray dated December 28, 2015. FINDINGS: The cardiomediastinal silhouette is normal in size. Normal pulmonary vascularity. Atherosclerotic calcification of the aortic arch. Chronic hyperinflation. No focal consolidation, pleural effusion, or pneumothorax. No acute osseous abnormality. Old left-sided rib and distal clavicle fractures. Diffuse osteopenia. IMPRESSION: No active cardiopulmonary disease. Electronically Signed   By: Obie DredgeWilliam T Derry  M.D.   On: 05/28/2017 09:07   Ct Head Wo Contrast  Result Date: 05/28/2017 CLINICAL DATA:  Altered level of consciousness. Fall from bed overnight. Confusion. EXAM: CT HEAD WITHOUT CONTRAST CT CERVICAL SPINE WITHOUT CONTRAST TECHNIQUE: Multidetector CT imaging of the head and cervical spine was performed following the standard protocol without intravenous contrast. Multiplanar CT image reconstructions of the cervical spine were also generated. COMPARISON:  12/28/2015 head CT. FINDINGS: CT HEAD FINDINGS Brain: No evidence of parenchymal hemorrhage or extra-axial fluid collection. No mass lesion, mass effect, or midline shift. No CT evidence of acute infarction. Generalized cerebral volume loss. Nonspecific moderate subcortical and periventricular white matter hypodensity, most in keeping with chronic small  vessel ischemic change. No ventriculomegaly. Vascular: No acute abnormality. Skull: No evidence of calvarial fracture. Sinuses/Orbits: The visualized paranasal sinuses are essentially clear. Other:  The mastoid air cells are unopacified. CT CERVICAL SPINE FINDINGS Alignment: Straightening of the cervical spine. No facet subluxation. There is 2 mm anterolisthesis at C2-3, 3 mm anterolisthesis at C3-4, 3 mm anterolisthesis at C4-5 and 2 mm anterolisthesis at C7-T1. Dens is well positioned between the lateral masses of C1. Skull base and vertebrae: No acute fracture. No primary bone lesion or focal pathologic process. Soft tissues and spinal canal: No prevertebral fluid or swelling. No visible canal hematoma. Disc levels: Marked multilevel degenerative disc disease throughout the cervical spine, most prominent in the lower cervical spine at C5-6 and C6-7. Marked bilateral facet arthropathy, asymmetrically prominent on the right . Moderate to severe foraminal stenosis on the right at C3-4. Moderate foraminal stenosis on the right at C4-5. Mild foraminal stenosis bilaterally at C5-6. Upper chest: Symmetric biapical pleural-parenchymal scarring with internal scattered calcifications. Other: Visualized mastoid air cells appear clear. Small hypodense bilateral thyroid nodules, largest 0.9 cm on the right. No pathologically enlarged cervical nodes. IMPRESSION: 1. No evidence of acute intracranial abnormality. No evidence of calvarial fracture . 2. Generalized cerebral volume loss and moderate chronic small vessel ischemic change. 3. No cervical spine fracture. 4. Advanced degenerative changes in the cervical spine with multilevel degenerative spondylolisthesis and foraminal stenosis as detailed. Electronically Signed   By: Delbert Phenix M.D.   On: 05/28/2017 09:31   Ct Cervical Spine Wo Contrast  Result Date: 05/28/2017 CLINICAL DATA:  Altered level of consciousness. Fall from bed overnight. Confusion. EXAM: CT HEAD  WITHOUT CONTRAST CT CERVICAL SPINE WITHOUT CONTRAST TECHNIQUE: Multidetector CT imaging of the head and cervical spine was performed following the standard protocol without intravenous contrast. Multiplanar CT image reconstructions of the cervical spine were also generated. COMPARISON:  12/28/2015 head CT. FINDINGS: CT HEAD FINDINGS Brain: No evidence of parenchymal hemorrhage or extra-axial fluid collection. No mass lesion, mass effect, or midline shift. No CT evidence of acute infarction. Generalized cerebral volume loss. Nonspecific moderate subcortical and periventricular white matter hypodensity, most in keeping with chronic small vessel ischemic change. No ventriculomegaly. Vascular: No acute abnormality. Skull: No evidence of calvarial fracture. Sinuses/Orbits: The visualized paranasal sinuses are essentially clear. Other:  The mastoid air cells are unopacified. CT CERVICAL SPINE FINDINGS Alignment: Straightening of the cervical spine. No facet subluxation. There is 2 mm anterolisthesis at C2-3, 3 mm anterolisthesis at C3-4, 3 mm anterolisthesis at C4-5 and 2 mm anterolisthesis at C7-T1. Dens is well positioned between the lateral masses of C1. Skull base and vertebrae: No acute fracture. No primary bone lesion or focal pathologic process. Soft tissues and spinal canal: No prevertebral fluid or swelling. No visible canal hematoma. Disc levels:  Marked multilevel degenerative disc disease throughout the cervical spine, most prominent in the lower cervical spine at C5-6 and C6-7. Marked bilateral facet arthropathy, asymmetrically prominent on the right . Moderate to severe foraminal stenosis on the right at C3-4. Moderate foraminal stenosis on the right at C4-5. Mild foraminal stenosis bilaterally at C5-6. Upper chest: Symmetric biapical pleural-parenchymal scarring with internal scattered calcifications. Other: Visualized mastoid air cells appear clear. Small hypodense bilateral thyroid nodules, largest 0.9 cm  on the right. No pathologically enlarged cervical nodes. IMPRESSION: 1. No evidence of acute intracranial abnormality. No evidence of calvarial fracture . 2. Generalized cerebral volume loss and moderate chronic small vessel ischemic change. 3. No cervical spine fracture. 4. Advanced degenerative changes in the cervical spine with multilevel degenerative spondylolisthesis and foraminal stenosis as detailed. Electronically Signed   By: Delbert Phenix M.D.   On: 05/28/2017 09:31    Procedures Procedures (including critical care time)  Medications Ordered in ED Medications  cefTRIAXone (ROCEPHIN) 1 g in dextrose 5 % 50 mL IVPB (0 g Intravenous Stopped 05/28/17 1115)     Initial Impression / Assessment and Plan / ED Course  I have reviewed the triage vital signs and the nursing notes.  Pertinent labs & imaging results that were available during my care of the patient were reviewed by me and considered in my medical decision making (see chart for details).     Patient presents to ED from ALF for fall, possible UTI, confusion. She was found to have fallen and landed under her bed. She reports dysuria and foul smelling urine for the past several days. She is alert, nontoxic appearing, not in acute distress. Alert and oriented x3. There are no visible wounds or bruises noted. Afebrile with history of fever. Urinalysis showed evidence of acute UTI. BMP, CBC unremarkable. Troponin negative 1. EKG showed no change in tracings from prior. EKG showed no significant change since prior tracing. CT of head and C-spine returned as negative for acute abnormality. CXR unremarkable.   Discussed with family about patient's goals for treatment. They state that they prefer if patient return to her nursing facility but be transferred to the skilled portion. They will contact the facility for any potential changes. Patient given 1 g of Rocephin here in the ED. We'll consult social work if necessary.  1100: Discussed  with family who states that they have contacted facility and that they are ready to transfer patient to assisted portion. They've requested social work consult and paperwork regarding transfer. We'll consult social work for further evaluation.  1450: Spoke to social work who states that patient is ready for transfer to rehabilitation section of assisted living facility. Will speak to family regarding whether they want patient to be transported via private vehicle or ambulance. Discharge paperwork provided. Strict return precautions given.  Patient discussed with and seen by Dr. Lynelle Doctor.  Final Clinical Impressions(s) / ED Diagnoses   Final diagnoses:  None    New Prescriptions New Prescriptions   No medications on file     Dietrich Pates, [place] 05/28/17 1518

## 2017-05-28 NOTE — Progress Notes (Addendum)
CSW called [name] supervisor Pt has been accepted by: [facility] Number for report is: [phone] Pt's unit/room/bed number will be: 143 on SNF side   PTAR can go to [facility] [facility] [address] then take a left on [address] and go to [facility]  Accepting physician: SNF MD  Pt can arrive ASAP on 05/28/17  CSW will update RN and EDP.  Dorothe PeaJonathan F. Desean Moran, Jennifer SorLCSWA, LCAS, CSI Clinical Social Worker Ph: [phone]

## 2017-05-28 NOTE — Discharge Instructions (Signed)
Please read attached information regarding your condition. Take Keflex twice daily for 7 days. Return to ED for worsening pain, trouble breathing, additional falls, chest pain, abdominal pain.

## 2017-05-28 NOTE — ED Notes (Signed)
Attempted to give report to [facility] with no answer.

## 2017-05-28 NOTE — ED Provider Notes (Signed)
Patient presented to the emergency room after a fall at her assisted living facility. Patient was found on the floor of her room. Her CT scans are reassuring. Urinalysis does suggest urinary tract infection. I discussed the findings with the family.They would prefer to go back to [facility] if she is able to move into the skilled portion of that facility.  If that is not an option today she will need to be admitted for observation.  Medical screening examination/treatment/procedure(s) were conducted as a shared visit with non-physician practitioner(s) and myself.  I personally evaluated the patient during the encounter.   EKG Interpretation  Date/Time:  Tuesday May 28 2017 08:27:51 EDT Ventricular Rate:  90 PR Interval:    QRS Duration: 95 QT Interval:  386 QTC Calculation: 473 R Axis:   76 Text Interpretation:  Sinus rhythm Atrial premature complexes Borderline prolonged PR interval Left ventricular hypertrophy Anterior infarct, old No significant change since last tracing Confirmed by Linwood DibblesKnapp, Larue Drawdy [phone]) on 05/28/2017 8:44:14 AM        Linwood DibblesKnapp, Aizlyn Schifano, MD 05/28/17 1008

## 2017-05-28 NOTE — ED Triage Notes (Addendum)
Pt from [facility], arrived via [facility]. Pt fell out of bed during the night, found by staff laying under the bed. Unsure of what happened. Pt is confused at times which is not her norm, possible UTI. Staff reports strong foul smelling urine. No complaint of injury from the fall

## 2017-05-28 NOTE — Progress Notes (Addendum)
LCSW following for disposition/discharge planning  2:33 PM LCSW has called back again to discuss plans with director of social worker.  There is a Psychologist, prison and probation servicesnew director named Dorothe PeaDonna Tessitore  [phone] who reports they are able to accept patient to rehab facility this afternoon.  Will discuss with family means of transport via car or EMS.  LCSW will discuss with MD with regards to plans for admission as stated possible observation or to [facility].   NO FL2 is needed at this time. Did send what clinicals were available through the [facility].  Patient is from [facility] per chart review.  Family requesting to return and be admitted to skilled nursing facility portion of [facility].  Call placed to Chamille who is Interior and spatial designerdirector of social services at the facility. Message left and will follow up once call returned regarding outcomes for patient and placement consideration.    Will follow up.  Deretha EmoryHannah Mariaclara Spear LCSW, MSW Clinical Social Work: Optician, dispensingystem [employer] Coverage for :  [phone]

## 2017-05-28 NOTE — ED Notes (Signed)
Bed: WA21 Expected date:  Expected time:  Means of arrival:  Comments: EMS- 83yo F, fall/confused

## 2017-06-04 ENCOUNTER — Encounter: Payer: Self-pay | Admitting: Internal Medicine

## 2017-06-04 ENCOUNTER — Non-Acute Institutional Stay (SKILLED_NURSING_FACILITY): Payer: Medicare Other | Admitting: Internal Medicine

## 2017-06-04 DIAGNOSIS — R918 Other nonspecific abnormal finding of lung field: Secondary | ICD-10-CM

## 2017-06-04 DIAGNOSIS — R54 Age-related physical debility: Secondary | ICD-10-CM

## 2017-06-04 DIAGNOSIS — B372 Candidiasis of skin and nail: Secondary | ICD-10-CM

## 2017-06-04 DIAGNOSIS — R1310 Dysphagia, unspecified: Secondary | ICD-10-CM | POA: Diagnosis not present

## 2017-06-04 DIAGNOSIS — R05 Cough: Secondary | ICD-10-CM | POA: Diagnosis not present

## 2017-06-04 DIAGNOSIS — R059 Cough, unspecified: Secondary | ICD-10-CM

## 2017-06-04 DIAGNOSIS — N3000 Acute cystitis without hematuria: Secondary | ICD-10-CM | POA: Diagnosis not present

## 2017-06-04 NOTE — Progress Notes (Signed)
Patient ID: [name], female   DOB: Aug 11, 1915, 83 y.o.   MRN: [id number]  Provider:  Gwenith Spitz. Renato Gails, D.O., C.M.D. Location:  [facility] Room Number: 143 Rehab Place of Service:  SNF (31)  PCP: Kermit Balo, DO Patient Care Team: Kermit Balo, DO as PCP - General (Geriatric Medicine) [facility]  Extended Emergency Contact Information Primary Emergency Contact: Alcide Evener [place] of [place] Home Phone: [phone] Mobile Phone: [phone] Relation: Other Secondary Emergency Contact: Manson Passey [place] of [place] Home Phone: [phone] Work Phone: [phone] Relation: Nephew  Code Status: DNR Goals of Care: Advanced Directive information Advanced Directives 06/04/2017  Does Patient Have a Medical Advance Directive? Yes  Type of Estate agent of [place];Living will;Out of facility DNR (pink MOST or yellow form)  Does patient want to make changes to medical advance directive? -  Copy of Healthcare Power of Attorney in Chart? Yes  Pre-existing out of facility DNR order (yellow form or pink MOST form) Yellow form placed in chart (order not valid for inpatient use);Pink MOST form placed in chart (order not valid for inpatient use)   Chief Complaint  Patient presents with  . New Admit To SNF    Rehab admission    HPI: Patient is a 83 y.o. female with h/o lung mass for several years for which she refused workup, chronic cough, low back pain, some gradual cognitive decline and frailty seen today for admission 05/28/17 to rehab s/p fall where she was found halfway under her bed in her AL apt.  She was taken to the ED at that time due to concerns she may have hit her head as she was very confused at the time.  She has a MOST form for comfort care and no hospitalizations at her request.  In the ED 8/14, she was found to probably have a UTI with positive urinalysis for blood,  LE, and nitrite, but no culture was done.  She had mild leukocytosis at 11.1.  She was given a dose of rocephin 1g IV and fluids there.  She was then prescribed keflex 500mg  po bid for 7 days, probiotic was added upon arrival back to [facility] to the rehab unit.  She has been started on nystatin powder for yeast in her groin.  OT eval has been written to see if she is still safe to use her power chair.    When seen today, she reports some irritation on her left side where her depend is rubbing her.  She says it was red there.  She has her chronic cough.  She denies any other pain.  She continues with urinary urgency and frequency, but reports these are not new or different.  She gets up 5-6 times at hs.  She's had a sitter here at night due to her worsened confusion and has been requiring help to walk to the restroom with her rollator walker.  She admits she requires more help than she did and that it might be necessary to keep her from falling.  We discussed that it might be hard for her to get the care she needs in the AL environment having to use the restroom that often.      Past Medical History:  Diagnosis Date  . Acquired deformity of chest and rib 05/28/2007  . Chest pain, unspecified 12/02/2001  . Cough 08/24/1998  . Edema 03/25/2006  . Fracture of neck of femur (HCC) 03/19/2006  . Internal  hemorrhoids without mention of complication 02/13/1999  . Lumbago 02/19/2007  . Osteoporosis, unspecified 03/25/2006  . Pain in limb 12/02/2001  . Primary localized osteoarthrosis, unspecified site 06/05/2006  . Rectal prolapse 07/10/2006  . Scoliosis (and kyphoscoliosis), idiopathic 05/14/2006  . Seborrhea 10/04/2004  . Torticollis, unspecified 10/04/2004  . Unspecified urinary incontinence 02/28/2000   Past Surgical History:  Procedure Laterality Date  . APPENDECTOMY    . BOTOX INJECTION  12/2005   right neck torticollis/cervical dystonia  . CATARACT EXTRACTION    . COLONOSCOPY  2000  . HEMORRHOID SURGERY       reports that she has never smoked. She has never used smokeless tobacco. She reports that she does not drink alcohol or use drugs. Social History   Social History  . Marital status: Single    Spouse name: N/A  . Number of children: N/A  . Years of education: N/A   Occupational History  . retired Psychiatrist Professor    Social History Main Topics  . Smoking status: Never Smoker  . Smokeless tobacco: Never Used  . Alcohol use No  . Drug use: No  . Sexual activity: No   Other Topics Concern  . Not on file   Social History Narrative   Patient is Single, never married, no children. 1 of 13 siblings.   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 Stroke Mother     Health Maintenance  Topic Date Due  . [name]  08/27/1934  . DEXA SCAN  08/27/1982  . PNA vac Low Risk Adult (1 of 2 - PCV13) 08/27/1982  . INFLUENZA VACCINE  05/15/2017    No Known Allergies  Outpatient Encounter Prescriptions as of 06/04/2017  Medication Sig  . cephALEXin (KEFLEX) 500 MG capsule Take 1 capsule (500 mg total) by mouth 2 (two) times daily.  Marland Kitchen nystatin (NYSTATIN) powder Apply Nystatin powder to reddened areas BID (groin and buttocks), if not healed after 7 days notify MD for further orders  . saccharomyces boulardii (FLORASTOR) 250 MG capsule Take 250 mg by mouth 2 (two) times daily.   No facility-administered encounter medications on file as of 06/04/2017.     Review of Systems  Constitutional: Positive for fatigue.  Negative for activity change, appetite change, chills and fever.  HENT: Negative for congestion.   Eyes: Negative for visual disturbance.  Respiratory: Positive for cough. Negative for chest tightness and shortness of breath.   Cardiovascular: Negative for chest pain, palpitations and leg swelling.  Gastrointestinal: Negative for abdominal pain, constipation, diarrhea, nausea and vomiting.  Genitourinary: Negative for dysuria.       Incontinence  Musculoskeletal: Positive for gait problem. Negative for arthralgias and joint swelling.  Skin: Negative for color change.  Neurological: Negative for dizziness and weakness.  Psychiatric/Behavioral: Positive for confusion. Negative for behavioral problems, sleep disturbance and suicidal ideas. The patient is not nervous/anxious.     Vitals:   06/04/17 1040  BP: 122/62  Pulse: 85  Resp: 18  Temp: 97.8 F (36.6 C)  TempSrc: Oral  SpO2: 95%  Weight: 87 lb (39.5 kg)   Body mass index is 18.18 kg/m. Physical Exam  Constitutional: She is oriented to person, place, and time. No distress.  Frail white female seated in recliner chair  HENT:  Head: Normocephalic and atraumatic.  Right Ear: External ear normal.  Left Ear: External ear normal.  Mouth/Throat: Oropharynx is clear and moist. No oropharyngeal exudate.  rhinorrhea  Eyes: Pupils are equal, round, and reactive to light. Conjunctivae and EOM are normal.  Neck: Normal range of motion. Neck supple. No JVD present.  Cardiovascular: Normal rate, regular rhythm and intact distal pulses.   Murmur heard. Pulmonary/Chest: Effort normal and breath sounds normal. No respiratory distress.  Abdominal: Soft. Bowel sounds are normal. She exhibits no distension and no mass. There is no tenderness. There is no rebound and no guarding.  Musculoskeletal: Normal range of motion.  Kyphosis severe  Lymphadenopathy:    She has no cervical adenopathy.  Neurological: She is alert and oriented to person,  place, and time. Coordination normal.  Some short term memory loss, not a good historian   Skin: Skin is warm and dry. No rash noted. No erythema.  Psychiatric: She has a normal mood and affect. Her behavior is normal.    Labs reviewed: Basic Metabolic Panel:  Recent Labs  16/10/96 0848  NA 140  K 3.3*  CL 99*  CO2 29  GLUCOSE 121*  BUN 21*  CREATININE 0.72  CALCIUM 8.9   Liver Function Tests: No results for input(s): AST, ALT, ALKPHOS, BILITOT, PROT, ALBUMIN in the last 8760 hours. No results for input(s): LIPASE, AMYLASE in the last 8760 hours. No results for input(s): AMMONIA in the last 8760 hours. CBC:  Recent Labs  05/28/17 0848  WBC 11.1*  NEUTROABS 8.7*  HGB 12.4  HCT 37.9  MCV 99.2  PLT 195   Cardiac Enzymes: No results for input(s): CKTOTAL, CKMB, CKMBINDEX, TROPONINI in the last 8760 hours. BNP: Invalid input(s): POCBNP No results found for: HGBA1C No results found for: TSH No results found for: VITAMINB12 No results found for: FOLATE No results found for: IRON, TIBC, FERRITIN  Imaging and Procedures obtained prior to SNF admission: Dg Chest 2 View  Result Date: 05/28/2017 CLINICAL DATA:  Fall, altered mental status. EXAM: CHEST  2 VIEW COMPARISON:  Chest x-ray dated December 28, 2015. FINDINGS: The cardiomediastinal silhouette is normal in size. Normal pulmonary vascularity. Atherosclerotic calcification of the aortic arch. Chronic hyperinflation. No focal consolidation, pleural effusion, or pneumothorax. No acute osseous abnormality. Old left-sided rib and distal clavicle fractures. Diffuse osteopenia. IMPRESSION: No active cardiopulmonary disease. Electronically Signed   By: Obie Dredge M.D.   On: 05/28/2017 09:07   Ct Head Wo Contrast  Result Date: 05/28/2017 CLINICAL DATA:  Altered level of consciousness. Fall from bed overnight. Confusion. EXAM: CT HEAD WITHOUT CONTRAST CT CERVICAL SPINE WITHOUT CONTRAST TECHNIQUE: Multidetector CT imaging of  the head and cervical spine was performed following the standard protocol without intravenous contrast. Multiplanar CT image reconstructions of the cervical spine were also generated. COMPARISON:  12/28/2015 head CT. FINDINGS: CT HEAD FINDINGS Brain: No evidence of parenchymal hemorrhage or extra-axial fluid collection. No mass lesion, mass effect, or midline shift. No CT evidence of acute infarction. Generalized cerebral volume loss. Nonspecific moderate subcortical and periventricular white matter hypodensity, most in keeping with chronic small vessel ischemic change. No ventriculomegaly. Vascular: No acute abnormality. Skull: No evidence of calvarial fracture. Sinuses/Orbits:  The visualized paranasal sinuses are essentially clear. Other:  The mastoid air cells are unopacified. CT CERVICAL SPINE FINDINGS Alignment: Straightening of the cervical spine. No facet subluxation. There is 2 mm anterolisthesis at C2-3, 3 mm anterolisthesis at C3-4, 3 mm anterolisthesis at C4-5 and 2 mm anterolisthesis at C7-T1. Dens is well positioned between the lateral masses of C1. Skull base and vertebrae: No acute fracture. No primary bone lesion or focal pathologic process. Soft tissues and spinal canal: No prevertebral fluid or swelling. No visible canal hematoma. Disc levels: Marked multilevel degenerative disc disease throughout the cervical spine, most prominent in the lower cervical spine at C5-6 and C6-7. Marked bilateral facet arthropathy, asymmetrically prominent on the right . Moderate to severe foraminal stenosis on the right at C3-4. Moderate foraminal stenosis on the right at C4-5. Mild foraminal stenosis bilaterally at C5-6. Upper chest: Symmetric biapical pleural-parenchymal scarring with internal scattered calcifications. Other: Visualized mastoid air cells appear clear. Small hypodense bilateral thyroid nodules, largest 0.9 cm on the right. No pathologically enlarged cervical nodes. IMPRESSION: 1. No evidence of  acute intracranial abnormality. No evidence of calvarial fracture . 2. Generalized cerebral volume loss and moderate chronic small vessel ischemic change. 3. No cervical spine fracture. 4. Advanced degenerative changes in the cervical spine with multilevel degenerative spondylolisthesis and foraminal stenosis as detailed. Electronically Signed   By: Delbert Phenix M.D.   On: 05/28/2017 09:31   Ct Cervical Spine Wo Contrast  Result Date: 05/28/2017 CLINICAL DATA:  Altered level of consciousness. Fall from bed overnight. Confusion. EXAM: CT HEAD WITHOUT CONTRAST CT CERVICAL SPINE WITHOUT CONTRAST TECHNIQUE: Multidetector CT imaging of the head and cervical spine was performed following the standard protocol without intravenous contrast. Multiplanar CT image reconstructions of the cervical spine were also generated. COMPARISON:  12/28/2015 head CT. FINDINGS: CT HEAD FINDINGS Brain: No evidence of parenchymal hemorrhage or extra-axial fluid collection. No mass lesion, mass effect, or midline shift. No CT evidence of acute infarction. Generalized cerebral volume loss. Nonspecific moderate subcortical and periventricular white matter hypodensity, most in keeping with chronic small vessel ischemic change. No ventriculomegaly. Vascular: No acute abnormality. Skull: No evidence of calvarial fracture. Sinuses/Orbits: The visualized paranasal sinuses are essentially clear. Other:  The mastoid air cells are unopacified. CT CERVICAL SPINE FINDINGS Alignment: Straightening of the cervical spine. No facet subluxation. There is 2 mm anterolisthesis at C2-3, 3 mm anterolisthesis at C3-4, 3 mm anterolisthesis at C4-5 and 2 mm anterolisthesis at C7-T1. Dens is well positioned between the lateral masses of C1. Skull base and vertebrae: No acute fracture. No primary bone lesion or focal pathologic process. Soft tissues and spinal canal: No prevertebral fluid or swelling. No visible canal hematoma. Disc levels: Marked multilevel  degenerative disc disease throughout the cervical spine, most prominent in the lower cervical spine at C5-6 and C6-7. Marked bilateral facet arthropathy, asymmetrically prominent on the right . Moderate to severe foraminal stenosis on the right at C3-4. Moderate foraminal stenosis on the right at C4-5. Mild foraminal stenosis bilaterally at C5-6. Upper chest: Symmetric biapical pleural-parenchymal scarring with internal scattered calcifications. Other: Visualized mastoid air cells appear clear. Small hypodense bilateral thyroid nodules, largest 0.9 cm on the right. No pathologically enlarged cervical nodes. IMPRESSION: 1. No evidence of acute intracranial abnormality. No evidence of calvarial fracture . 2. Generalized cerebral volume loss and moderate chronic small vessel ischemic change. 3. No cervical spine fracture. 4. Advanced degenerative changes in the cervical spine with multilevel degenerative spondylolisthesis and foraminal stenosis as detailed.  Electronically Signed   By: Delbert Phenix M.D.   On: 05/28/2017 09:31    Assessment/Plan 1. Acute cystitis without hematuria -seems this has resolved with keflex therapy -urine culture never done in ED so will never know if keflex was appropriate abx -delirium improved  2. Dysphagia, unspecified type -ongoing, likely contributing to cough, aspiration precautions, if problematic, ST eval  3. Cough -has been chronic, multfactorial with #2, #4, pt refuses any interventions, seems we are more bothered by the symptom than she is  4. Lung mass -not even noted on last xray oddly, but has been [facility] the past few years, felt to be malignant, pt with gradual weight loss, but overall frail state  5. Skin yeast infection -cont nystatin powder in groin, seems she's got more irritation just from depends and their adhesive  6. Frailty syndrome in geriatric patient -primary issue now--PT eval and tx, but suspect she is simply becoming more and  more frail with age 48, incontinence, worsening mobility, dysphagia, weight loss, cognitive losses  Family/ staff Communication: discussed with rehab nursing, NP, and pt's POA who was present for part of the visit  Labs/tests ordered:  PT eval  Keinan Brouillet L. Muhammed Teutsch, D.O. Geriatrics [facility] [address], [place] [address] Cell Phone (Mon-Fri 8am-5pm):  [phone] On Call:  [phone] & follow prompts after 5pm & weekends Office Phone:  [phone] Office Fax:  [phone]

## 2017-06-10 DIAGNOSIS — R54 Age-related physical debility: Secondary | ICD-10-CM | POA: Insufficient documentation

## 2017-06-18 ENCOUNTER — Non-Acute Institutional Stay (SKILLED_NURSING_FACILITY): Payer: Medicare Other | Admitting: Internal Medicine

## 2017-06-18 ENCOUNTER — Encounter: Payer: Self-pay | Admitting: Internal Medicine

## 2017-06-18 DIAGNOSIS — R54 Age-related physical debility: Secondary | ICD-10-CM

## 2017-06-18 DIAGNOSIS — N3281 Overactive bladder: Secondary | ICD-10-CM

## 2017-06-18 NOTE — Progress Notes (Signed)
Patient ID: [name], female   DOB: 02-17-1915, 83 y.o.   MRN: [id number]  Location:  [facility]  Nursing Home Room Number: [id number] rehab Place of Service:  SNF [phone]) Provider:   Kermit Balo, DO  Patient Care Team: Kermit Balo, DO as PCP - General (Geriatric Medicine) [facility]  Extended Emergency Contact Information Primary Emergency Contact: Alcide Evener [place] of [place] Home Phone: [phone] Mobile Phone: [phone] Relation: Other Secondary Emergency Contact: Manson Passey [place] Home Phone: [phone] Work Phone: [phone] Relation: Nephew  Code Status:  DNR Goals of care: Advanced Directive information Advanced Directives 06/18/2017  Does Patient Have a Medical Advance Directive? Yes  Type of Advance Directive Out of facility DNR (pink MOST or yellow form);Healthcare Power of Attorney  Does patient want to make changes to medical advance directive? -  Copy of Healthcare Power of Attorney in Chart? Yes  Pre-existing out of facility DNR order (yellow form or pink MOST form) Yellow form placed in chart (order not valid for inpatient use);Pink MOST form placed in chart (order not valid for inpatient use)   Chief Complaint  Patient presents with  . Acute Visit    discuss medications    HPI:  Pt is a 83 y.o. female seen today for an acute visit for urinary frequency.  Pt has been getting up according to her only 3x at night to urinate, but staff report she's up between a dozen and 20 times per night. She does pass some urine.  She's already been treated for a UTI during her hospitalization and has no other symptoms to suggest infection including fever, chills, abdominal pain, dysuria, flank pain or hematuria.  She reports having this problem for years (did have it during previous visits, as well, even over a year ago).  Staff have suggested she be placed on a medication to slow this  process so she can get more sleep.  I have suggested this on more than one occasion in the past.  We discussed myrbetriq again today, and pt refuses any medication.  She says she is fine and she lived to 19 b/c she didn't take medicine.    Past Medical History:  Diagnosis Date  . Acquired deformity of chest and rib 05/28/2007  . Chest pain, unspecified 12/02/2001  . Cough 08/24/1998  . Edema 03/25/2006  . Fracture of neck of femur (HCC) 03/19/2006  . Internal hemorrhoids without mention of complication 02/13/1999  . Lumbago 02/19/2007  . Osteoporosis, unspecified 03/25/2006  . Pain in limb 12/02/2001  . Primary localized osteoarthrosis, unspecified site 06/05/2006  . Rectal prolapse 07/10/2006  . Scoliosis (and kyphoscoliosis), idiopathic 05/14/2006  . Seborrhea 10/04/2004  . Torticollis, unspecified 10/04/2004  . Unspecified urinary incontinence 02/28/2000   Past Surgical History:  Procedure Laterality Date  . APPENDECTOMY    . BOTOX INJECTION  12/2005   right neck torticollis/cervical dystonia  . CATARACT EXTRACTION    . COLONOSCOPY  2000  . HEMORRHOID SURGERY      No Known Allergies  Outpatient Encounter Prescriptions as of 06/18/2017  Medication Sig  . [DISCONTINUED] nystatin (NYSTATIN) powder Apply Nystatin powder to reddened areas BID (groin and buttocks), if not healed after 7 days notify MD for further orders  . [DISCONTINUED] saccharomyces boulardii (FLORASTOR) 250 MG capsule Take 250 mg by mouth 2 (two) times daily.   No facility-administered encounter medications on file as of 06/18/2017.     Review of Systems  Constitutional:  Negative for chills and fever.  HENT: Negative for congestion.   Respiratory: Negative for chest tightness and shortness of breath.   Cardiovascular: Negative for chest pain and leg swelling.  Gastrointestinal: Negative for constipation.  Genitourinary: Positive for frequency.  Musculoskeletal: Positive for gait problem.       Kyphoscoliosis    Neurological: Positive for weakness. Negative for dizziness.  Psychiatric/Behavioral: Positive for confusion.    Immunization History  Administered Date(s) Administered  . Influenza Inj Mdck Quad Pf 08/02/2016  . Influenza Whole 08/14/2013  . Influenza-Unspecified 07/29/2014, 08/04/2015   Pertinent  Health Maintenance Due  Topic Date Due  . DEXA SCAN  08/27/1982  . PNA vac Low Risk Adult (1 of 2 - PCV13) 08/27/1982  . INFLUENZA VACCINE  05/15/2017   Fall Risk  03/20/2017  Falls in the past year? No   Functional Status Survey:  dependent for transfers, personal care and toileting, uses walker to ambulate with assistance  Vitals:   06/18/17 1033  BP: (!) 148/60  Pulse: 66  Resp: (!) 25  Temp: (!) 97.4 F (36.3 C)  TempSrc: Oral  SpO2: 97%  Weight: 89 lb (40.4 kg)   Body mass index is 18.6 kg/m. Physical Exam  Constitutional: No distress.  Cardiovascular: Normal rate, regular rhythm, normal heart sounds and intact distal pulses.   Pulmonary/Chest: Effort normal and breath sounds normal. No respiratory distress.  Abdominal: Soft. Bowel sounds are normal. She exhibits no distension. There is no tenderness.  Genitourinary:  Genitourinary Comments: No suprapubic pain  Musculoskeletal: Normal range of motion.  kyphoscoliosis  Neurological: She is alert.  Skin: Skin is warm and dry.  Psychiatric: She has a normal mood and affect.    Labs reviewed:  Recent Labs  05/28/17 0848  NA 140  K 3.3*  CL 99*  CO2 29  GLUCOSE 121*  BUN 21*  CREATININE 0.72  CALCIUM 8.9   No results for input(s): AST, ALT, ALKPHOS, BILITOT, PROT, ALBUMIN in the last 8760 hours.  Recent Labs  05/28/17 0848  WBC 11.1*  NEUTROABS 8.7*  HGB 12.4  HCT 37.9  MCV 99.2  PLT 195   No results found for: TSH No results found for: HGBA1C No results found for: CHOL, HDL, LDLCALC, LDLDIRECT, TRIG, CHOLHDL  Significant Diagnostic Results in last 30 days:  Dg Chest 2 View  Result Date:  05/28/2017 CLINICAL DATA:  Fall, altered mental status. EXAM: CHEST  2 VIEW COMPARISON:  Chest x-ray dated December 28, 2015. FINDINGS: The cardiomediastinal silhouette is normal in size. Normal pulmonary vascularity. Atherosclerotic calcification of the aortic arch. Chronic hyperinflation. No focal consolidation, pleural effusion, or pneumothorax. No acute osseous abnormality. Old left-sided rib and distal clavicle fractures. Diffuse osteopenia. IMPRESSION: No active cardiopulmonary disease. Electronically Signed   By: Obie Dredge M.D.   On: 05/28/2017 09:07   Ct Head Wo Contrast  Result Date: 05/28/2017 CLINICAL DATA:  Altered level of consciousness. Fall from bed overnight. Confusion. EXAM: CT HEAD WITHOUT CONTRAST CT CERVICAL SPINE WITHOUT CONTRAST TECHNIQUE: Multidetector CT imaging of the head and cervical spine was performed following the standard protocol without intravenous contrast. Multiplanar CT image reconstructions of the cervical spine were also generated. COMPARISON:  12/28/2015 head CT. FINDINGS: CT HEAD FINDINGS Brain: No evidence of parenchymal hemorrhage or extra-axial fluid collection. No mass lesion, mass effect, or midline shift. No CT evidence of acute infarction. Generalized cerebral volume loss. Nonspecific moderate subcortical and periventricular white matter hypodensity, most in keeping with chronic small vessel ischemic  change. No ventriculomegaly. Vascular: No acute abnormality. Skull: No evidence of calvarial fracture. Sinuses/Orbits: The visualized paranasal sinuses are essentially clear. Other:  The mastoid air cells are unopacified. CT CERVICAL SPINE FINDINGS Alignment: Straightening of the cervical spine. No facet subluxation. There is 2 mm anterolisthesis at C2-3, 3 mm anterolisthesis at C3-4, 3 mm anterolisthesis at C4-5 and 2 mm anterolisthesis at C7-T1. Dens is well positioned between the lateral masses of C1. Skull base and vertebrae: No acute fracture. No primary bone  lesion or focal pathologic process. Soft tissues and spinal canal: No prevertebral fluid or swelling. No visible canal hematoma. Disc levels: Marked multilevel degenerative disc disease throughout the cervical spine, most prominent in the lower cervical spine at C5-6 and C6-7. Marked bilateral facet arthropathy, asymmetrically prominent on the right . Moderate to severe foraminal stenosis on the right at C3-4. Moderate foraminal stenosis on the right at C4-5. Mild foraminal stenosis bilaterally at C5-6. Upper chest: Symmetric biapical pleural-parenchymal scarring with internal scattered calcifications. Other: Visualized mastoid air cells appear clear. Small hypodense bilateral thyroid nodules, largest 0.9 cm on the right. No pathologically enlarged cervical nodes. IMPRESSION: 1. No evidence of acute intracranial abnormality. No evidence of calvarial fracture . 2. Generalized cerebral volume loss and moderate chronic small vessel ischemic change. 3. No cervical spine fracture. 4. Advanced degenerative changes in the cervical spine with multilevel degenerative spondylolisthesis and foraminal stenosis as detailed. Electronically Signed   By: Delbert Phenix M.D.   On: 05/28/2017 09:31   Ct Cervical Spine Wo Contrast  Result Date: 05/28/2017 CLINICAL DATA:  Altered level of consciousness. Fall from bed overnight. Confusion. EXAM: CT HEAD WITHOUT CONTRAST CT CERVICAL SPINE WITHOUT CONTRAST TECHNIQUE: Multidetector CT imaging of the head and cervical spine was performed following the standard protocol without intravenous contrast. Multiplanar CT image reconstructions of the cervical spine were also generated. COMPARISON:  12/28/2015 head CT. FINDINGS: CT HEAD FINDINGS Brain: No evidence of parenchymal hemorrhage or extra-axial fluid collection. No mass lesion, mass effect, or midline shift. No CT evidence of acute infarction. Generalized cerebral volume loss. Nonspecific moderate subcortical and periventricular white  matter hypodensity, most in keeping with chronic small vessel ischemic change. No ventriculomegaly. Vascular: No acute abnormality. Skull: No evidence of calvarial fracture. Sinuses/Orbits: The visualized paranasal sinuses are essentially clear. Other:  The mastoid air cells are unopacified. CT CERVICAL SPINE FINDINGS Alignment: Straightening of the cervical spine. No facet subluxation. There is 2 mm anterolisthesis at C2-3, 3 mm anterolisthesis at C3-4, 3 mm anterolisthesis at C4-5 and 2 mm anterolisthesis at C7-T1. Dens is well positioned between the lateral masses of C1. Skull base and vertebrae: No acute fracture. No primary bone lesion or focal pathologic process. Soft tissues and spinal canal: No prevertebral fluid or swelling. No visible canal hematoma. Disc levels: Marked multilevel degenerative disc disease throughout the cervical spine, most prominent in the lower cervical spine at C5-6 and C6-7. Marked bilateral facet arthropathy, asymmetrically prominent on the right . Moderate to severe foraminal stenosis on the right at C3-4. Moderate foraminal stenosis on the right at C4-5. Mild foraminal stenosis bilaterally at C5-6. Upper chest: Symmetric biapical pleural-parenchymal scarring with internal scattered calcifications. Other: Visualized mastoid air cells appear clear. Small hypodense bilateral thyroid nodules, largest 0.9 cm on the right. No pathologically enlarged cervical nodes. IMPRESSION: 1. No evidence of acute intracranial abnormality. No evidence of calvarial fracture . 2. Generalized cerebral volume loss and moderate chronic small vessel ischemic change. 3. No cervical spine fracture. 4. Advanced degenerative  changes in the cervical spine with multilevel degenerative spondylolisthesis and foraminal stenosis as detailed. Electronically Signed   By: Delbert PhenixJason A Poff M.D.   On: 05/28/2017 09:31    Assessment/Plan 1. Overactive bladder -unable to convince pt to try myrbetriq therapy today despite  my best efforts   2. Frailty syndrome in geriatric patient -ongoing, she is now in need of skilled care -she is moving to 132 for ongoing care in the near future  Family/ staff Communication: discussed with rehab nurse  Labs/tests ordered:  No new  [name], D.O. Geriatrics [facility] [address] [place], [place] [address] Cell Phone (Mon-Fri 8am-5pm):  [phone] On Call:  [phone] & follow prompts after 5pm & weekends Office Phone:  [phone] Office Fax:  [phone]

## 2017-07-04 ENCOUNTER — Non-Acute Institutional Stay (SKILLED_NURSING_FACILITY): Payer: Medicare Other | Admitting: Adult Health

## 2017-07-04 ENCOUNTER — Encounter: Payer: Self-pay | Admitting: Adult Health

## 2017-07-04 DIAGNOSIS — R413 Other amnesia: Secondary | ICD-10-CM | POA: Diagnosis not present

## 2017-07-04 DIAGNOSIS — R251 Tremor, unspecified: Secondary | ICD-10-CM | POA: Diagnosis not present

## 2017-07-04 DIAGNOSIS — R54 Age-related physical debility: Secondary | ICD-10-CM | POA: Diagnosis not present

## 2017-07-04 DIAGNOSIS — R918 Other nonspecific abnormal finding of lung field: Secondary | ICD-10-CM

## 2017-07-04 DIAGNOSIS — N3281 Overactive bladder: Secondary | ICD-10-CM | POA: Diagnosis not present

## 2017-07-04 NOTE — Progress Notes (Signed)
Location:  Medical illustrator of Service:  SNF (31) Provider:   Peggye Ley, ANP [facility] [phone]   Kermit Balo, DO  Patient Care Team: Kermit Balo, DO as PCP - General (Geriatric Medicine) [facility], [facility]  Extended Emergency Contact Information Primary Emergency Contact: Alcide Evener [place] of [place] Home Phone: [phone] Mobile Phone: [phone] Relation: Other Secondary Emergency Contact: Manson Passey [place] Home Phone: [phone] Work Phone: [phone] Relation: Nephew  Code Status:  DNR Goals of care: Advanced Directive information Advanced Directives 07/04/2017  Does Patient Have a Medical Advance Directive? Yes  Type of Advance Directive Out of facility DNR (pink MOST or yellow form);Healthcare Power of Attorney  Does patient want to make changes to medical advance directive? -  Copy of Healthcare Power of Attorney in Chart? Yes  Pre-existing out of facility DNR order (yellow form or pink MOST form) Yellow form placed in chart (order not valid for inpatient use);Pink MOST form placed in chart (order not valid for inpatient use)     Chief Complaint  Patient presents with  . Medical Management of Chronic Issues    HPI:  Pt is a 83 y.o. female seen today for medical management of chronic diseases.  She is moving to skilled care as of today. She has no complaints regarding her care. She has a hx of urinary frequency and was placed on myrbetriq a couple of weeks ago and reports that this has helped.  She has a low weight that is stable for the past two months. Boost is ordered twice a day but she typically refuses it and does not like the taste. Wt Readings from Last 3 Encounters:  07/04/17 86 lb 6.4 oz (39.2 kg)  06/18/17 89 lb (40.4 kg)  06/04/17 87 lb (39.5 kg)  She has had issues with recurrent UTI but denies any urinary symptoms at present. Staff report  she has poor peri hygiene and needs assistance with ADLs, hence the move to skilled care. She is ambulatory with a rollator. Has an essential tremor that can interfere with her ability to dress herself.  No recent MMSE for review in her chart, has some short term memory loss.  Pulmonary nodule noted in 2015 was not present on the past two CXRs.  Past Medical History:  Diagnosis Date  . Acquired deformity of chest and rib 05/28/2007  . Chest pain, unspecified 12/02/2001  . Cough 08/24/1998  . Edema 03/25/2006  . Fracture of neck of femur (HCC) 03/19/2006  . Internal hemorrhoids without mention of complication 02/13/1999  . Lumbago 02/19/2007  . Osteoporosis, unspecified 03/25/2006  . Pain in limb 12/02/2001  . Primary localized osteoarthrosis, unspecified site 06/05/2006  . Rectal prolapse 07/10/2006  . Scoliosis (and kyphoscoliosis), idiopathic 05/14/2006  . Seborrhea 10/04/2004  . Torticollis, unspecified 10/04/2004  . Unspecified urinary incontinence 02/28/2000   Past Surgical History:  Procedure Laterality Date  . APPENDECTOMY    . BOTOX INJECTION  12/2005   right neck torticollis/cervical dystonia  . CATARACT EXTRACTION    . COLONOSCOPY  2000  . HEMORRHOID SURGERY      No Known Allergies  Outpatient Encounter Prescriptions as of 07/04/2017  Medication Sig  . mirabegron ER (MYRBETRIQ) 25 MG TB24 tablet Take 25 mg by mouth daily.   No facility-administered encounter medications on file as of 07/04/2017.     Review of Systems  Constitutional: Negative for activity change, appetite change, chills, diaphoresis, fatigue,  fever and unexpected weight change.  HENT: Negative for congestion.   Respiratory: Negative for cough, choking, shortness of breath, wheezing and stridor.   Cardiovascular: Negative for chest pain and palpitations.  Gastrointestinal: Negative for abdominal distention, abdominal pain, constipation and diarrhea.  Genitourinary: Negative for difficulty urinating, dysuria,  flank pain and frequency.  Musculoskeletal: Positive for gait problem. Negative for arthralgias and back pain.  Skin: Negative for color change, rash and wound.  Neurological: Positive for tremors and weakness. Negative for dizziness, seizures, syncope, facial asymmetry, speech difficulty, light-headedness, numbness and headaches.  Psychiatric/Behavioral: Negative for agitation, behavioral problems, confusion and sleep disturbance.       Short term memory loss    Immunization History  Administered Date(s) Administered  . Influenza Inj Mdck Quad Pf 08/02/2016  . Influenza Whole 08/14/2013  . Influenza-Unspecified 07/29/2014, 08/04/2015   Pertinent  Health Maintenance Due  Topic Date Due  . DEXA SCAN  08/27/1982  . PNA vac Low Risk Adult (1 of 2 - PCV13) 08/27/1982  . INFLUENZA VACCINE  05/15/2017   Fall Risk  03/20/2017  Falls in the past year? No   Functional Status Survey:    Vitals:   07/04/17 0954  BP: 125/68  Pulse: 77  Resp: 17  Temp: (!) 97.5 F (36.4 C)  SpO2: 95%  Weight: 86 lb 6.4 oz (39.2 kg)   Body mass index is 18.06 kg/m. Physical Exam  Constitutional: She is oriented to person, place, and time. No distress.  Thin frail femlae  HENT:  Head: Normocephalic and atraumatic.  Neck: No JVD present.  Cardiovascular: Normal rate and regular rhythm.   Murmur heard. Pulmonary/Chest: Effort normal and breath sounds normal. No respiratory distress. She has no wheezes.  Abdominal: Soft. Bowel sounds are normal. She exhibits no distension.  Musculoskeletal: She exhibits no edema or tenderness.  kyphosis  Neurological: She is alert and oriented to person, place, and time.  Tremor to both hands and head  Skin: Skin is warm and dry. She is not diaphoretic.  Psychiatric: She has a normal mood and affect.  Nursing note and vitals reviewed.   Labs reviewed:  Recent Labs  05/28/17 0848  NA 140  K 3.3*  CL 99*  CO2 29  GLUCOSE 121*  BUN 21*  CREATININE 0.72    CALCIUM 8.9   No results for input(s): AST, ALT, ALKPHOS, BILITOT, PROT, ALBUMIN in the last 8760 hours.  Recent Labs  05/28/17 0848  WBC 11.1*  NEUTROABS 8.7*  HGB 12.4  HCT 37.9  MCV 99.2  PLT 195   No results found for: TSH No results found for: HGBA1C No results found for: CHOL, HDL, LDLCALC, LDLDIRECT, TRIG, CHOLHDL  Significant Diagnostic Results in last 30 days:  No results found.  Assessment/Plan  1. OAB (overactive bladder) Improved Continue myrbetriq 25 mg qhs  2. Tremor Likely essential, continue to monitor  3. Frailty syndrome in geriatric patient Resident wants boost discontinued due to preference issues Low weight, very frail, high fall risk She is appropriate for the move to skilled care and will benefit from a higher level of care  4. Lung mass Not noted on recent CXR, no symptoms Goals of care comfort  5. Memory loss Noted during my visit Needs MMSE Supportive care only, resident does not like to take medication    Family/ staff Communication: discussed with resident  Labs/tests ordered:  NA

## 2017-08-02 ENCOUNTER — Encounter: Payer: Self-pay | Admitting: Adult Health

## 2017-08-02 ENCOUNTER — Non-Acute Institutional Stay (SKILLED_NURSING_FACILITY): Payer: Medicare Other | Admitting: Adult Health

## 2017-08-02 DIAGNOSIS — R413 Other amnesia: Secondary | ICD-10-CM

## 2017-08-02 DIAGNOSIS — K5901 Slow transit constipation: Secondary | ICD-10-CM

## 2017-08-02 DIAGNOSIS — N3281 Overactive bladder: Secondary | ICD-10-CM | POA: Diagnosis not present

## 2017-08-02 DIAGNOSIS — R059 Cough, unspecified: Secondary | ICD-10-CM

## 2017-08-02 DIAGNOSIS — R1310 Dysphagia, unspecified: Secondary | ICD-10-CM

## 2017-08-02 DIAGNOSIS — R05 Cough: Secondary | ICD-10-CM | POA: Diagnosis not present

## 2017-08-02 NOTE — Progress Notes (Signed)
[facility]   Place of Service:  SNF (31) Provider:   Peggye Leyhristy Chosen Geske, ANP [facility] [phone]   Kermit Baloeed, [name] L, DO  Patient Care Team: Kermit Baloeed, [name] L, DO as PCP - General (Geriatric Medicine) [facility], [facility]  Extended Emergency Contact Information Primary Emergency Contact: Alcide EvenerSomers,[name]  [place] Home Phone: [phone] Mobile Phone: [phone] Relation: Other Secondary Emergency Contact: Manson PasseyBerry,M.Douglas  [place] Home Phone: [phone] Work Phone: [phone] Relation: Nephew  Code Status:  DNR Goals of care: Advanced Directive information Advanced Directives 08/02/2017  Does Patient Have a Medical Advance Directive? Yes  Type of Advance Directive Out of facility DNR (pink MOST or yellow form);Healthcare Power of Attorney  Does patient want to make changes to medical advance directive? -  Copy of Healthcare Power of Attorney in Chart? Yes  Pre-existing out of facility DNR order (yellow form or pink MOST form) Yellow form placed in chart (order not valid for inpatient use);Pink MOST form placed in chart (order not valid for inpatient use)     Chief Complaint  Patient presents with  . Medical Management of Chronic Issues    HPI:  Pt is a 83 y.o. female seen today for medical management of chronic diseases. She recently moved to skilled care and seems to be adjusting well.   OAB: reports decreased frequency with mybetriq  Memory loss:MMSE 25/30 recorded on 06/2017 She remains pleasant, cooperative, and continues to enjoy activities and crossword puzzles  Dysphagia: hx of choking and is at risk for aspiration, did not want further work up and is there fore on a regular diet  During my visit she coughed several times. She denies any cough or cold symptoms. No fever or decreased 02 sats, she feels well.  She has had sick contacts.  Past Medical History:  Diagnosis  Date  . Acquired deformity of chest and rib 05/28/2007  . Chest pain, unspecified 12/02/2001  . Cough 08/24/1998  . Edema 03/25/2006  . Fracture of neck of femur (HCC) 03/19/2006  . Internal hemorrhoids without mention of complication 02/13/1999  . Lumbago 02/19/2007  . Osteoporosis, unspecified 03/25/2006  . Pain in limb 12/02/2001  . Primary localized osteoarthrosis, unspecified site 06/05/2006  . Rectal prolapse 07/10/2006  . Scoliosis (and kyphoscoliosis), idiopathic 05/14/2006  . Seborrhea 10/04/2004  . Torticollis, unspecified 10/04/2004  . Unspecified urinary incontinence 02/28/2000   Past Surgical History:  Procedure Laterality Date  . APPENDECTOMY    . BOTOX INJECTION  12/2005   right neck torticollis/cervical dystonia  . CATARACT EXTRACTION    . COLONOSCOPY  2000  . HEMORRHOID SURGERY      No Known Allergies  Outpatient Encounter Prescriptions as of 08/02/2017  Medication Sig  . mirabegron ER (MYRBETRIQ) 25 MG TB24 tablet Take 25 mg by mouth daily.   No facility-administered encounter medications on file as of 08/02/2017.     Review of Systems  Constitutional: Negative for chills, diaphoresis, fever, malaise/fatigue and weight loss.  HENT: Positive for hearing loss.   Respiratory: Positive for cough. Negative for hemoptysis, sputum production and wheezing.   Cardiovascular: Negative for chest pain, leg swelling and PND.  Gastrointestinal: Negative for abdominal pain, constipation, diarrhea, nausea and vomiting.  Genitourinary: Positive for frequency. Negative for dysuria and urgency.  Musculoskeletal: Positive for falls and joint pain. Negative for back pain, myalgias and neck pain.  Skin: Negative for itching and rash.  Neurological: Positive for tremors. Negative for dizziness, tingling, weakness  and headaches.  Psychiatric/Behavioral: Positive for memory loss. Negative for depression, hallucinations and substance abuse. The patient has insomnia. The patient is not  nervous/anxious.       Immunization History  Administered Date(s) Administered  . Influenza Inj Mdck Quad Pf 08/02/2016  . Influenza Whole 08/14/2013  . Influenza-Unspecified 07/29/2014, 08/04/2015   Pertinent  Health Maintenance Due  Topic Date Due  . DEXA SCAN  08/27/1982  . PNA vac Low Risk Adult (1 of 2 - PCV13) 08/27/1982  . INFLUENZA VACCINE  05/15/2017   Fall Risk  03/20/2017  Falls in the past year? No   Functional Status Survey:    Vitals:   08/02/17 1541  BP: 127/62  Pulse: 80  Resp: 18  Temp: 98.6 F (37 C)  SpO2: 95%  Weight: 87 lb 6.4 oz (39.6 kg)   Body mass index is 18.27 kg/m.  Physical Exam  Constitutional: She is oriented to person, place, and time. No distress.  Thin and frail  HENT:  Head: Normocephalic and atraumatic.  Right Ear: External ear normal.  Left Ear: External ear normal.  Mouth/Throat: No oropharyngeal exudate.  Eyes: Pupils are equal, round, and reactive to light. Conjunctivae and EOM are normal.  Neck: No JVD present. No thyromegaly present.  Cardiovascular: Normal rate and normal heart sounds.   No murmur heard. Pulmonary/Chest: Effort normal. No stridor. No respiratory distress.  Rhonchi noted to bilateral upper lobes  Abdominal: Soft. Bowel sounds are normal.  Musculoskeletal: Normal range of motion.  Lymphadenopathy:    She has no cervical adenopathy.  Neurological: She is alert and oriented to person, place, and time. No cranial nerve deficit.  Skin: Skin is warm and dry. She is not diaphoretic.    Labs reviewed:  Recent Labs  05/28/17 0848  NA 140  K 3.3*  CL 99*  CO2 29  GLUCOSE 121*  BUN 21*  CREATININE 0.72  CALCIUM 8.9   No results for input(s): AST, ALT, ALKPHOS, BILITOT, PROT, ALBUMIN in the last 8760 hours.  Recent Labs  05/28/17 0848  WBC 11.1*  NEUTROABS 8.7*  HGB 12.4  HCT 37.9  MCV 99.2  PLT 195   No results found for: TSH No results found for: HGBA1C No results found for: CHOL, HDL,  LDLCALC, LDLDIRECT, TRIG, CHOLHDL  Significant Diagnostic Results in last 30 days:  No results found.  Assessment/Plan  1. Cough Symptoms only present for 1 day, no fever or decreased 02 sats VS qshift for 48 hrs  Duoneb q 6 hrs prn cough  2. Memory loss Noted on MMSE, remains able to do cross word puzzles and participate in her own care. She does not like to take medication and would not want anything for this problem.  3. Dysphagia, unspecified type Continue regular diet with thin liquid as per resident request Should remain upright for all meals and chew food throughly  4. OAB (overactive bladder) Improved Continue myrbetriq 25 mg qhs  5. Constipation Denies having issues and controls this with diet choices.  Family/ staff Communication: discussed with resident  Labs/tests ordered:  NA

## 2017-09-25 ENCOUNTER — Encounter: Payer: Self-pay | Admitting: Internal Medicine

## 2017-09-25 ENCOUNTER — Non-Acute Institutional Stay: Payer: Medicare Other | Admitting: Internal Medicine

## 2017-09-25 VITALS — BP 120/60 | HR 82 | Temp 98.2°F | Ht <= 58 in | Wt 85.0 lb

## 2017-09-25 DIAGNOSIS — N3281 Overactive bladder: Secondary | ICD-10-CM

## 2017-09-25 DIAGNOSIS — K117 Disturbances of salivary secretion: Secondary | ICD-10-CM | POA: Insufficient documentation

## 2017-09-25 DIAGNOSIS — Z0000 Encounter for general adult medical examination without abnormal findings: Secondary | ICD-10-CM | POA: Diagnosis not present

## 2017-09-25 DIAGNOSIS — R54 Age-related physical debility: Secondary | ICD-10-CM

## 2017-09-25 NOTE — Progress Notes (Signed)
Patient ID: [name], female   DOB: 1915-08-12, 83 y.o.   MRN: [id number]  Provider:  Gwenith Spitziffany L. Renato Gailseed, D.O., C.M.D. [facility]   Place of Service:  SNF (31)   PCP: Kermit Baloeed, Veniamin Kincaid L, DO Patient Care Team: Kermit Baloeed, Ieasha Boerema L, DO as PCP - General (Geriatric Medicine) [facility]  Extended Emergency Contact Information Primary Emergency Contact: Alcide EvenerSomers,[name]  [place] Home Phone: [phone] Mobile Phone: [phone] Relation: Other Secondary Emergency Contact: Manson PasseyBerry,M.Douglas  [place] Home Phone: [phone] Work Phone: [phone] Relation: Nephew  Code Status: DNR Goals of Care: Advanced Directive information Advanced Directives 09/25/2017  Does Patient Have a Medical Advance Directive? Yes  Type of Advance Directive Out of facility DNR (pink MOST or yellow form);Healthcare Power of Attorney  Does patient want to make changes to medical advance directive? No - Patient declined  Copy of Healthcare Power of Attorney in Chart? Yes  Pre-existing out of facility DNR order (yellow form or pink MOST form) Yellow form placed in chart (order not valid for inpatient use);Pink MOST form placed in chart (order not valid for inpatient use)   Chief Complaint  Patient presents with  . Annual Exam    Medicare Wellness exam    HPI: Patient is a [age] y.o. female seen today for an annual comprehensive examination and wellness visit.   She thinks she is doing better with myrbetriq.    She has been coughing and does feel a little short of breath and low energy.  Generally less energetic.  Not feeling ill.  No change in appetite.  Says she's always gotten up at night and it continues despite myrbetriq.  At least up once.  See flowsheet for AWV info.   MMSE 25/30 and passed clock. Not depressed on PHQ2. Hearing well, seeing well.   She believes she had her pneumonia vaccines years ago.  She is now 83.     Past Medical History:  Diagnosis Date  . Acquired deformity of chest and rib 05/28/2007  . Chest pain, unspecified 12/02/2001  . Cough 08/24/1998  . Edema 03/25/2006  . Fracture of neck of femur (HCC) 03/19/2006  . Internal hemorrhoids without mention of complication 02/13/1999  . Lumbago 02/19/2007  . Osteoporosis, unspecified 03/25/2006  . Pain in limb 12/02/2001  . Primary localized osteoarthrosis, unspecified site 06/05/2006  . Rectal prolapse 07/10/2006  . Scoliosis (and kyphoscoliosis), idiopathic 05/14/2006  . Seborrhea 10/04/2004  . Torticollis, unspecified 10/04/2004  . Unspecified urinary incontinence 02/28/2000   Past Surgical History:  Procedure Laterality Date  . APPENDECTOMY    . BOTOX INJECTION  12/2005   right neck torticollis/cervical dystonia  . CATARACT EXTRACTION    . COLONOSCOPY  2000  . HEMORRHOID SURGERY      reports that  has never smoked. she has never used smokeless tobacco. She reports that she does not drink alcohol or use drugs. Social History   Socioeconomic History  . Marital status: Single    Spouse name: None  . Number of children: None  . Years of education: None  . Highest education level: None  Social Needs  . Financial resource strain: None  . Food insecurity - worry: None  . Food insecurity - inability: None  . Transportation needs - medical: None  . Transportation needs - non-medical: None  Occupational History  . 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 Smoking status: Never Smoker  . Smokeless tobacco: Never Used  Substance and  Sexual Activity  . Alcohol use: No  . Drug use: No  . Sexual activity: No  Other Topics Concern  . None  Social History Narrative   Patient is Single, never married, no children. 1 of 13 siblings.   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 Stroke Mother     Pertinent  Health Maintenance Due  Topic Date Due  . DEXA SCAN  08/27/1982  . PNA vac Low Risk Adult (1 of 2 - PCV13) 08/27/1982  . INFLUENZA VACCINE  Completed   Fall Risk  03/20/2017  Falls in the past year? No   Depression screen PHQ 2/9 03/20/2017  Decreased Interest 0  Down, Depressed, Hopeless 0  PHQ - 2 Score 0    Functional Status Survey:  dependent in adls except feeding.  No Known Allergies  Outpatient Encounter Medications as of 09/25/2017  Medication Sig  . hydroxypropyl methylcellulose / hypromellose (ISOPTO TEARS / GONIOVISC) 2.5 % ophthalmic solution Place 1 drop into both eyes as needed for dry eyes.  Marland Kitchen. ipratropium-albuterol (DUONEB) 0.5-2.5 (3) MG/3ML SOLN 3 mLs every 6 (six) hours as needed.   . mirabegron ER (MYRBETRIQ) 25 MG TB24 tablet Take 25 mg by mouth daily.   No facility-administered encounter medications on file as of 09/25/2017.     Review of Systems  Constitutional: Positive for activity change, fatigue and unexpected weight change. Negative for appetite change, chills and fever.  HENT: Negative for congestion, hearing loss, sore throat, trouble swallowing and voice change.        Hypersalivation, drooling  Eyes: Negative for visual disturbance.  Respiratory: Positive for cough and shortness of breath. Negative for apnea, choking, chest tightness, wheezing and stridor.   Cardiovascular: Negative for chest pain, palpitations and leg swelling.  Gastrointestinal: Negative for abdominal distention, abdominal pain, blood in stool and constipation.  Genitourinary: Positive for frequency and urgency. Negative for dysuria.       Incontinence  Musculoskeletal: Positive for gait problem.       Increasingly frail, difficulty  walking distances, uses walker short distances and in room, manual wheelchair long distances  Skin: Positive for pallor.  Neurological: Positive for weakness. Negative for dizziness.  Hematological: Bruises/bleeds easily.  Psychiatric/Behavioral: Positive for confusion.       Admits to short term memory loss    Vitals:   09/25/17 1347  BP: 120/60  Pulse: 82  Temp: 98.2 F (36.8 C)  TempSrc: Oral  SpO2: 96%  Weight: 85 lb (38.6 kg)  Height: 4\' 10"  (1.473 m)   Body mass index is 17.77 kg/m. Physical Exam  Constitutional: She is oriented to person, place, and time. She appears well-developed and well-nourished. No distress.  HENT:  Head: Normocephalic and atraumatic.  Right Ear: External ear normal.  Left Ear: External ear normal.  Nose: Nose normal.  Mouth/Throat: Oropharynx is clear and moist. No oropharyngeal exudate.  Eyes: Conjunctivae and EOM are normal. Pupils are equal, round, and reactive to light.  Neck: Normal range of motion. Neck supple. No JVD present. No thyromegaly present.  Submandibular adenopathy and possibly some salivary  hyperplasia  Cardiovascular: Normal rate, regular rhythm, normal heart sounds and intact distal pulses.  Pulmonary/Chest: Effort normal and breath sounds normal. No respiratory distress.  Abdominal: Soft. Bowel sounds are normal. She exhibits no distension. There is no tenderness.  Musculoskeletal: Normal range of motion.  Was pushed over here today in manual wheelchair  Lymphadenopathy:    She has no cervical adenopathy.  Neurological: She is alert and oriented to person, place, and time. No cranial nerve deficit. Coordination normal.  Short term memory loss, poor historian  Skin: Skin is warm and dry.  Psychiatric: She has a normal mood and affect.    Labs reviewed: Basic Metabolic Panel: Recent Labs    05/28/17 0848  NA 140  K 3.3*  CL 99*  CO2 29  GLUCOSE 121*  BUN 21*  CREATININE 0.72  CALCIUM 8.9   Liver Function  Tests: No results for input(s): AST, ALT, ALKPHOS, BILITOT, PROT, ALBUMIN in the last 8760 hours. No results for input(s): LIPASE, AMYLASE in the last 8760 hours. No results for input(s): AMMONIA in the last 8760 hours. CBC: Recent Labs    05/28/17 0848  WBC 11.1*  NEUTROABS 8.7*  HGB 12.4  HCT 37.9  MCV 99.2  PLT 195    Assessment/Plan 1. Medicare annual wellness visit, subsequent -performed today -pt reports having had pneumonia vaccines since 67 (she's now 89) -will get shingrix with others per facility plans  -up to date on flu -aged out of other preventive care  2. Annual physical exam -performed today -notable adenopathy and salivary gland hyperplasia, but pt refuses any workup, also has known lung mass for many years for which she's refused biopsy or workup  3. Frailty syndrome in geriatric patient -progressing, weaker with less energy since last visit and less mobile, cont walker as able and wheelchair for distances  4. OAB (overactive bladder) -staff noted improvement with myrbetriq b/c pt was getting up as many as a dozen times overnight to urinate before and now reduced to only a couple sometimes  5. Sialorrhea -notable and might have inflammation of salivary glands -encourage hydration and lozenge use to help with dry mouth that may cause this  Family/ staff Communication: discussed with SNF nursing  Labs/tests ordered:  none  Loretta Kluender L. Aldon Hengst, D.O. Geriatrics [facility] [address], [place] [address] Cell Phone (Mon-Fri 8am-5pm):  [phone] On Call:  [phone] & follow prompts after 5pm & weekends Office Phone:  [phone] Office Fax:  [phone]

## 2017-10-21 ENCOUNTER — Encounter: Payer: Self-pay | Admitting: Adult Health

## 2017-10-21 ENCOUNTER — Non-Acute Institutional Stay (SKILLED_NURSING_FACILITY): Payer: Medicare Other | Admitting: Adult Health

## 2017-10-21 DIAGNOSIS — N3281 Overactive bladder: Secondary | ICD-10-CM | POA: Diagnosis not present

## 2017-10-21 DIAGNOSIS — I872 Venous insufficiency (chronic) (peripheral): Secondary | ICD-10-CM

## 2017-10-21 DIAGNOSIS — R05 Cough: Secondary | ICD-10-CM

## 2017-10-21 DIAGNOSIS — K117 Disturbances of salivary secretion: Secondary | ICD-10-CM

## 2017-10-21 DIAGNOSIS — R059 Cough, unspecified: Secondary | ICD-10-CM

## 2017-10-21 DIAGNOSIS — R413 Other amnesia: Secondary | ICD-10-CM

## 2017-10-21 DIAGNOSIS — R1310 Dysphagia, unspecified: Secondary | ICD-10-CM

## 2017-10-21 NOTE — Assessment & Plan Note (Signed)
Chronic problem which is multifactorial. She does not feel acutely ill. I reminded her that she has prn duonebs if necessary.

## 2017-10-21 NOTE — Assessment & Plan Note (Signed)
Continue compression hose on in the am and off in the pm 

## 2017-10-21 NOTE — Assessment & Plan Note (Signed)
Noted short term loss and slowing down but continues to feed herself, participate in her care, and work on cross word puzzles.

## 2017-10-21 NOTE — Progress Notes (Signed)
Location:  Medical illustrator of Service:  SNF (31) Provider:   Peggye Ley, ANP [facility] [phone]   Kermit Balo, DO  Patient Care Team: Kermit Balo, DO as PCP - General (Geriatric Medicine) Lavonne Chick, [facility]  Extended Emergency Contact Information Primary Emergency Contact: Alcide Evener [place] Home Phone: [phone] Mobile Phone: [phone] Relation: Other Secondary Emergency Contact: Manson Passey [place] of [place] Home Phone: [phone] Work Phone: [phone] Relation: Nephew  Code Status:  DNR Goals of care: Advanced Directive information Advanced Directives 09/25/2017  Does Patient Have a Medical Advance Directive? Yes  Type of Advance Directive Out of facility DNR (pink MOST or yellow form);Healthcare Power of Attorney  Does patient want to make changes to medical advance directive? No - Patient declined  Copy of Healthcare Power of Attorney in Chart? Yes  Pre-existing out of facility DNR order (yellow form or pink MOST form) Yellow form placed in chart (order not valid for inpatient use);Pink MOST form placed in chart (order not valid for inpatient use)     Chief Complaint  Patient presents with  . Medical Management of Chronic Issues    HPI:  Pt is a 84 y.o. female seen today for medical management of chronic diseases.  She turned 89 in November and feels that she is slowing down. She continues to spend time in her recliner doing crossword puzzles and ambulates short distances. Other wise she uses her scooter for longer distances.  Cough: she has had a cough off and on for 3 months. There is a hx of dysphagia but she decided against a modified diet.  She denies any choking, mainly a non productive cough not associated with meals.   She remains rather frail at 85 lbs but her weight has declined significantly  Memory loss: MMSE 25/30   OAB: long term issue  that seems to improve with myrbetriq. Periods of frequency and incontinence are still noted but improved.   She was seen by Dr. Renato Gails last month and noted to have submandibular swelling and sialorrhea. She did not remember this issue and says that it is not bothering her.   Venous insuff: wears compression hose with minimal edema and no lower ext pain or ulceration  There was a hx of a lung mass that she declined work for in 2015 but this was not found on CXR in 2018  Past Medical History:  Diagnosis Date  . Acquired deformity of chest and rib 05/28/2007  . Chest pain, unspecified 12/02/2001  . Cough 08/24/1998  . Edema 03/25/2006  . Fracture of neck of femur (HCC) 03/19/2006  . Internal hemorrhoids without mention of complication 02/13/1999  . Lumbago 02/19/2007  . Osteoporosis, unspecified 03/25/2006  . Pain in limb 12/02/2001  . Primary localized osteoarthrosis, unspecified site 06/05/2006  . Rectal prolapse 07/10/2006  . Scoliosis (and kyphoscoliosis), idiopathic 05/14/2006  . Seborrhea 10/04/2004  . Torticollis, unspecified 10/04/2004  . Unspecified urinary incontinence 02/28/2000   Past Surgical History:  Procedure Laterality Date  . APPENDECTOMY    . BOTOX INJECTION  12/2005   right neck torticollis/cervical dystonia  . CATARACT EXTRACTION    . COLONOSCOPY  2000  . HEMORRHOID SURGERY      No Known Allergies  Outpatient Encounter Medications as of 10/21/2017  Medication Sig  . hydroxypropyl methylcellulose / hypromellose (ISOPTO TEARS / GONIOVISC) 2.5 % ophthalmic solution Place 1 drop into both eyes as needed for dry eyes.  Marland Kitchen  ipratropium-albuterol (DUONEB) 0.5-2.5 (3) MG/3ML SOLN 3 mLs every 6 (six) hours as needed.   . mirabegron ER (MYRBETRIQ) 25 MG TB24 tablet Take 25 mg by mouth daily.   No facility-administered encounter medications on file as of 10/21/2017.     Review of Systems  Constitutional: Positive for fatigue. Negative for activity change, appetite change, chills,  diaphoresis, fever and unexpected weight change.  HENT: Negative for congestion and trouble swallowing.   Respiratory: Positive for cough. Negative for shortness of breath and wheezing.   Cardiovascular: Negative for chest pain, palpitations and leg swelling.  Gastrointestinal: Negative for abdominal distention, abdominal pain, constipation and diarrhea.  Genitourinary: Positive for frequency. Negative for difficulty urinating, dysuria, flank pain and hematuria.  Musculoskeletal: Positive for arthralgias and gait problem. Negative for back pain, joint swelling and myalgias.  Skin: Negative for wound.  Neurological: Positive for tremors. Negative for dizziness, seizures, syncope, facial asymmetry, speech difficulty, weakness, light-headedness, numbness and headaches.  Psychiatric/Behavioral: Negative for agitation, behavioral problems, confusion and sleep disturbance.       Memory loss    Immunization History  Administered Date(s) Administered  . Influenza Inj Mdck Quad Pf 08/02/2016  . Influenza Whole 08/14/2013  . Influenza-Unspecified 07/29/2014, 08/04/2015, 08/08/2017  . Zoster 10/16/2011   Pertinent  Health Maintenance Due  Topic Date Due  . PNA vac Low Risk Adult (1 of 2 - PCV13) 08/27/1982  . INFLUENZA VACCINE  Completed  . DEXA SCAN  Discontinued   Fall Risk  03/20/2017  Falls in the past year? No   Functional Status Survey:    Vitals:   10/21/17 1516  BP: 118/74  Pulse: 68  Resp: 18  Temp: (!) 97.2 F (36.2 C)  SpO2: 100%  Weight: 85 lb (38.6 kg)   Body mass index is 17.77 kg/m. Physical Exam  Constitutional: She is oriented to person, place, and time. No distress.  HENT:  Head: Normocephalic and atraumatic.  Neck: No JVD present.  Adenopathy noted to submandibular and submental areas  Cardiovascular: Normal rate and regular rhythm.  No murmur heard. Pulmonary/Chest: Effort normal. No respiratory distress. She has wheezes (bilateral exp wheeze throughout).    Neurological: She is alert and oriented to person, place, and time.  Skin: Skin is warm and dry. She is not diaphoretic.  Psychiatric: She has a normal mood and affect.  Vitals reviewed.   Labs reviewed: Recent Labs    05/28/17 0848  NA 140  K 3.3*  CL 99*  CO2 29  GLUCOSE 121*  BUN 21*  CREATININE 0.72  CALCIUM 8.9   No results for input(s): AST, ALT, ALKPHOS, BILITOT, PROT, ALBUMIN in the last 8760 hours. Recent Labs    05/28/17 0848  WBC 11.1*  NEUTROABS 8.7*  HGB 12.4  HCT 37.9  MCV 99.2  PLT 195   No results found for: TSH No results found for: HGBA1C No results found for: CHOL, HDL, LDLCALC, LDLDIRECT, TRIG, CHOLHDL  Significant Diagnostic Results in last 30 days:  No results found.  Assessment/Plan  Dysphagia Contributes to her cough but has not caused any resp infections recently. Her goals of care are comfort based and has declined work up for this issue.   Chronic venous insufficiency Continue compression hose on in the am and off in the pm  Sialorrhea Denied any issues but exam findings of inflamed salivary glands were noted. Encourage oral intake  OAB (overactive bladder) Continue myrbetriq 25 mg qd  Cough Chronic problem which is multifactorial. She does not  feel acutely ill. I reminded her that she has prn duonebs if necessary.   Memory loss Noted short term loss and slowing down but continues to feed herself, participate in her care, and work on cross word puzzles.    Family/ staff Communication: discussed with staff/resident  Labs/tests ordered:  NA, comfort care

## 2017-10-21 NOTE — Assessment & Plan Note (Signed)
Continue myrbetriq 25 mg qd

## 2017-10-21 NOTE — Assessment & Plan Note (Signed)
Denied any issues but exam findings of inflamed salivary glands were noted. Encourage oral intake

## 2017-10-21 NOTE — Assessment & Plan Note (Signed)
Contributes to her cough but has not caused any resp infections recently. Her goals of care are comfort based and has declined work up for this issue.

## 2017-12-03 ENCOUNTER — Encounter: Payer: Self-pay | Admitting: Internal Medicine

## 2017-12-03 ENCOUNTER — Non-Acute Institutional Stay (SKILLED_NURSING_FACILITY): Payer: Medicare Other | Admitting: Internal Medicine

## 2017-12-03 DIAGNOSIS — R1310 Dysphagia, unspecified: Secondary | ICD-10-CM

## 2017-12-03 DIAGNOSIS — R531 Weakness: Secondary | ICD-10-CM

## 2017-12-03 DIAGNOSIS — R634 Abnormal weight loss: Secondary | ICD-10-CM

## 2017-12-03 DIAGNOSIS — R413 Other amnesia: Secondary | ICD-10-CM | POA: Diagnosis not present

## 2017-12-03 DIAGNOSIS — R05 Cough: Secondary | ICD-10-CM | POA: Diagnosis not present

## 2017-12-03 DIAGNOSIS — R54 Age-related physical debility: Secondary | ICD-10-CM

## 2017-12-03 DIAGNOSIS — R059 Cough, unspecified: Secondary | ICD-10-CM

## 2017-12-03 NOTE — Progress Notes (Signed)
Patient ID: [name], female   DOB: 1915/05/25, 84 y.o.   MRN: [id number]  Location:  [facility] Room Number: 142 Place of Service:  SNF [phone]) Provider:   Rosine Door, RN, DNP Student  Kermit Balo, DO  Patient Care Team: Kermit Balo, DO as PCP - General (Geriatric Medicine) Lavonne Chick, [facility]  Extended Emergency Contact Information Primary Emergency Contact: Alcide Evener [place] of [place] Home Phone: [phone] Mobile Phone: [phone] Relation: Other Secondary Emergency Contact: Manson Passey [place] Home Phone: [phone] Work Phone: [phone] Relation: Nephew  Code Status:  DNR Goals of care: Advanced Directive information Advanced Directives 12/03/2017  Does Patient Have a Medical Advance Directive? Yes  Type of Advance Directive Out of facility DNR (pink MOST or yellow form);Healthcare Power of Attorney  Does patient want to make changes to medical advance directive? No - Patient declined  Copy of Healthcare Power of Attorney in Chart? Yes  Pre-existing out of facility DNR order (yellow form or pink MOST form) Yellow form placed in chart (order not valid for inpatient use);Pink MOST form placed in chart (order not valid for inpatient use)     Chief Complaint  Patient presents with  . Medical Management of Chronic Issues    Routine Visit    HPI:  Pt is a 84 y.o. female seen today for medical management of chronic diseases. Today Dr. Allyson Sabal was seen in her room for a routine visit. She was found sitting up in her chair with her seated walker to her side. She is not watching t.v. And rather not at this time. She continues to enjoy her puzzles. She is dressed well and appears to be in good spirits with her communication. She states she does not worry about anything, she never really has she says.   Her only complaint today is the persistent cough she has had for 4  months now. She has a bag of cough drops on her bedside. She says they help some. The cough is non productive. She denies having difficulty eating, trouble chewing, or swallowing.   Weight: She is down to 83 pounds from 85 pounds January. She reports eating okay, and her nurse reports she is eating decently as well. She has had continued decline over the last several months with her highest being around 6 months ago, 89 pounds.   Frailty: She needs to utilize both a seated walker and a scooter (longer distances).She states she is unable to walk for long distances. Per Nursing she is walking to each meal, this is strongly encouraged.   Memory loss: She reports a continue of memory loss, but still enjoys her crossword puzzle. 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 She does not recall  A great deal of the past and she reports not being concerned or worried about this. "I am almost 89, guess that is expected."   Per nursing: She is down in weight, but has been more stable in the pass several months. She is eating and feeds herself. She gets assistance with bathing and dressing. She is encouraged to walk to each meal. She does get tired more easily now than she did, and appears to be having progressive weakness. She has no reported skin issues. She wears the pads/depends for the occasional accident, but is overall continent of both bowel and bladder. She is reported to be sleeping okay, with the need to get up and void a  few times. She has no behavior disturbances and is usually pleasant in conversation and enjoys being called Dr. Allyson Sabal.      She denies overall trouble sleeping. She reports no trouble eating, chewing or feeding herself. She is able to get dress and bath with assistance. She is occasionally incontinent, wears pads and depends. She reports a decline in memory, but feels this is normal. She has not has any falls and there is no evidence of skin breakdown.    Past  Medical History:  Diagnosis Date  . Acquired deformity of chest and rib 05/28/2007  . Chest pain, unspecified 12/02/2001  . Cough 08/24/1998  . Edema 03/25/2006  . Fracture of neck of femur (HCC) 03/19/2006  . Internal hemorrhoids without mention of complication 02/13/1999  . Lumbago 02/19/2007  . Osteoporosis, unspecified 03/25/2006  . Pain in limb 12/02/2001  . Primary localized osteoarthrosis, unspecified site 06/05/2006  . Rectal prolapse 07/10/2006  . Scoliosis (and kyphoscoliosis), idiopathic 05/14/2006  . Seborrhea 10/04/2004  . Torticollis, unspecified 10/04/2004  . Unspecified urinary incontinence 02/28/2000   Past Surgical History:  Procedure Laterality Date  . APPENDECTOMY    . BOTOX INJECTION  12/2005   right neck torticollis/cervical dystonia  . CATARACT EXTRACTION    . COLONOSCOPY  2000  . HEMORRHOID SURGERY      No Known Allergies  Outpatient Encounter Medications as of 12/03/2017  Medication Sig  . hydroxypropyl methylcellulose / hypromellose (ISOPTO TEARS / GONIOVISC) 2.5 % ophthalmic solution Place 1 drop into both eyes as needed for dry eyes.  . mirabegron ER (MYRBETRIQ) 25 MG TB24 tablet Take 25 mg by mouth daily.  . [DISCONTINUED] ipratropium-albuterol (DUONEB) 0.5-2.5 (3) MG/3ML SOLN 3 mLs every 6 (six) hours as needed.    No facility-administered encounter medications on file as of 12/03/2017.     Review of Systems  Constitutional: Positive for fatigue and unexpected weight change. Negative for appetite change.  HENT: Negative for congestion, drooling, postnasal drip, rhinorrhea and trouble swallowing.   Eyes: Negative.   Respiratory: Positive for cough. Negative for choking and shortness of breath.   Cardiovascular: Negative.   Gastrointestinal: Negative.   Endocrine: Negative.   Genitourinary: Positive for urgency. Negative for dysuria.  Musculoskeletal: Positive for arthralgias and gait problem.       Uses a seated walker  Skin: Positive for pallor.    Allergic/Immunologic: Negative.   Neurological: Positive for weakness. Negative for dizziness.  Psychiatric/Behavioral: Positive for sleep disturbance. Negative for agitation and confusion. The patient is not nervous/anxious.        Memory loss  Some difficulty sleeping    Immunization History  Administered Date(s) Administered  . Influenza Inj Mdck Quad Pf 08/02/2016  . Influenza Whole 08/14/2013  . Influenza-Unspecified 07/29/2014, 08/04/2015, 08/08/2017  . Zoster 10/16/2011   Pertinent  Health Maintenance Due  Topic Date Due  . PNA vac Low Risk Adult (1 of 2 - PCV13) 08/27/1982  . INFLUENZA VACCINE  Completed  . DEXA SCAN  Discontinued   Fall Risk  03/20/2017  Falls in the past year? No   Functional Status Survey:    Vitals:   12/03/17 1038  BP: 129/71  Pulse: 74  Resp: 19  Temp: 97.9 F (36.6 C)  TempSrc: Oral  SpO2: 97%  Weight: 83 lb (37.6 kg)   Body mass index is 17.35 kg/m. Physical Exam  Constitutional: She is oriented to person, place, and time. She appears well-developed.  HENT:  Head: Normocephalic.  Neck: Normal range of  motion. Neck supple. No thyromegaly present.  Cardiovascular: Normal rate, regular rhythm, normal heart sounds and intact distal pulses.  Pulmonary/Chest: Effort normal. She has no wheezes.  Abdominal: Soft. Bowel sounds are normal.  Musculoskeletal: Normal range of motion.  Stiffness with ROM, otherwise normal  Lymphadenopathy:    She has no cervical adenopathy.  Neurological: She is alert and oriented to person, place, and time.  Skin: Skin is warm and dry.  Psychiatric: She has a normal mood and affect. Her behavior is normal. Judgment and thought content normal.  Vitals reviewed.   Labs reviewed: Recent Labs    05/28/17 0848  NA 140  K 3.3*  CL 99*  CO2 29  GLUCOSE 121*  BUN 21*  CREATININE 0.72  CALCIUM 8.9   No results for input(s): AST, ALT, ALKPHOS, BILITOT, PROT, ALBUMIN in the last 8760 hours. Recent Labs     05/28/17 0848  WBC 11.1*  NEUTROABS 8.7*  HGB 12.4  HCT 37.9  MCV 99.2  PLT 195   No results found for: TSH No results found for: HGBA1C No results found for: CHOL, HDL, LDLCALC, LDLDIRECT, TRIG, CHOLHDL  Significant Diagnostic Results in last 30 days:  No results found.  Assessment/Plan 1. Frailty syndrome in geriatric patient Persistent and slow declining of strength. She is reduced in steps she can walk without needing to sit. Would maintain a walking program that keeps her walking to her meals, perhaps encouraging an afternoon walk between lunch and dinner. When the weather is nicer it would be good for her to get outside to help with motivation.   2. Dysphagia, unspecified type Her history of dysphagia could be contributing to the chronic cough. She states her cough is not related to eating or swallowing trouble. Declines diet change. Continue to monitor this for risk of aspiration is higher with her.   3. Cough She continues to have a cough that is on-going and bothersome. She uses cough drops around the clock to help. With history of dysphagia it is hard to know if she has issue with swallowing well. She declined a change in diet in the past. Could consider adding low dose dextromethorphan to her daily routine, once in the morning and once after lunch and or prior to dinner.   4. Memory loss Memory is not what it was, she continues to have slow decline. This is an expected and normal process for her age. She is still functional enough to do some crossword puzzles and enjoys them.   5. Weakness She continues to have a change in activity as well as strength. She uses a walker, but demonstrated reduced grip strength. This is concerning for a fall is she continues to lose strength and has trouble holding walker. It would be good to encourage her to be in an activity that requires body resistance weight training.   6. Weight loss, non-intentional Weight loss of two pounds this  last month. Would like her weighted more than monthly, perhaps weekly over the next month to monitor her. Can give her supplementation in the mid morning or early afternoon with ensure or boost to help maintain. As it was reported by her and the nurse she is eating. It can be expected for her to have a decline in appetite as time goes one.     Family/ staff Communication: Discussion with SNF nurse, Carollee HerterShannon.  Labs/tests ordered:  None

## 2017-12-15 ENCOUNTER — Other Ambulatory Visit: Payer: Self-pay

## 2017-12-15 ENCOUNTER — Emergency Department (HOSPITAL_COMMUNITY)
Admission: EM | Admit: 2017-12-15 | Discharge: 2017-12-15 | Disposition: A | Discharge status: Skilled Nursing Facility | Payer: Medicare Other | Attending: Emergency Medicine | Admitting: Emergency Medicine

## 2017-12-15 ENCOUNTER — Encounter (HOSPITAL_COMMUNITY): Payer: Self-pay | Admitting: Emergency Medicine

## 2017-12-15 ENCOUNTER — Emergency Department (HOSPITAL_COMMUNITY): Payer: Medicare Other

## 2017-12-15 DIAGNOSIS — W19XXXA Unspecified fall, initial encounter: Secondary | ICD-10-CM

## 2017-12-15 DIAGNOSIS — Y9389 Activity, other specified: Secondary | ICD-10-CM | POA: Diagnosis not present

## 2017-12-15 DIAGNOSIS — S01112A Laceration without foreign body of left eyelid and periocular area, initial encounter: Secondary | ICD-10-CM | POA: Diagnosis not present

## 2017-12-15 DIAGNOSIS — S0990XA Unspecified injury of head, initial encounter: Secondary | ICD-10-CM

## 2017-12-15 DIAGNOSIS — S0083XA Contusion of other part of head, initial encounter: Secondary | ICD-10-CM | POA: Diagnosis not present

## 2017-12-15 DIAGNOSIS — R079 Chest pain, unspecified: Secondary | ICD-10-CM | POA: Diagnosis not present

## 2017-12-15 DIAGNOSIS — Y999 Unspecified external cause status: Secondary | ICD-10-CM | POA: Diagnosis not present

## 2017-12-15 DIAGNOSIS — Z79899 Other long term (current) drug therapy: Secondary | ICD-10-CM | POA: Insufficient documentation

## 2017-12-15 DIAGNOSIS — Y92128 Other place in nursing home as the place of occurrence of the external cause: Secondary | ICD-10-CM | POA: Insufficient documentation

## 2017-12-15 MED ORDER — LORAZEPAM 2 MG/ML IJ SOLN
0.5000 mg | Freq: Once | INTRAMUSCULAR | Status: DC
Start: 2017-12-15 — End: 2017-12-15
  Filled 2017-12-15: qty 1

## 2017-12-15 MED ORDER — ACETAMINOPHEN 325 MG PO TABS: 650.0000 mg | ORAL_TABLET | Freq: Four times a day (QID) | ORAL | Status: DC | PRN

## 2017-12-15 MED ORDER — LORAZEPAM 2 MG/ML IJ SOLN
0.5000 mg | Freq: Once | INTRAMUSCULAR | Status: AC
  Administered 2017-12-15: 0.5 mg via INTRAVENOUS

## 2017-12-15 NOTE — ED Provider Notes (Signed)
[facility]  CSN: [id number] Arrival date & time: 12/15/17 [id number]  Chief Complaint(s) Fall  HPI [name] is a 84 y.o. female who is high functioning and lives in a skilled nursing facility presents to the emergency department after an unwitnessed mechanical fall.  Patient was found this morning beside her bed alert and oriented.  She sustained injury to her left forehead resulting in hematoma and skin tear.  Patient denies any overt pain at this time.  She does not remember the cause of her fall.  She does report remembering waking up this morning and having to use the restroom.  Currently denies any headache, visual disturbance, neck pain, chest pain, back pain, hip pain, extremity pain.  Family at bedside reports that the patient has had prior episodes of mechanical falls due to nighttime agitation and frequently getting up due to urinary frequency.  This has improved while on Myrbetriq.  Patient initially had a bedside sitter but this was discontinued.  HPI  Past Medical History Past Medical History:  Diagnosis Date  . Acquired deformity of chest and rib 05/28/2007  . Chest pain, unspecified 12/02/2001  . Cough 08/24/1998  . Edema 03/25/2006  . Fracture of neck of femur (HCC) 03/19/2006  . Internal hemorrhoids without mention of complication 02/13/1999  . Lumbago 02/19/2007  . Osteoporosis, unspecified 03/25/2006  . Pain in limb 12/02/2001  . Primary localized osteoarthrosis, unspecified site 06/05/2006  . Rectal prolapse 07/10/2006  . Scoliosis (and kyphoscoliosis), idiopathic 05/14/2006  . Seborrhea 10/04/2004  . Torticollis, unspecified 10/04/2004  . Unspecified urinary incontinence 02/28/2000   Patient Active Problem List   Diagnosis Date Noted  . Sialorrhea 09/25/2017  . OAB (overactive bladder) 07/04/2017  . Tremor 07/04/2017  . Memory loss 07/04/2017  . Frailty syndrome in geriatric patient 06/10/2017  . Chronic venous  insufficiency 02/28/2016  . Urinary incontinence, mixed 01/26/2016  . Constipation 12/30/2015  . Dysphagia 12/30/2015  . Lung mass 10/23/2013  . Lumbago 02/19/2007  . Torticollis, unspecified 10/04/2004  . Cough 08/24/1998   Home Medication(s) Prior to Admission medications   Medication Sig Start Date End Date Taking? Authorizing Provider  hydroxypropyl methylcellulose / hypromellose (ISOPTO TEARS / GONIOVISC) 2.5 % ophthalmic solution Place 1 drop into both eyes as needed for dry eyes.   Yes [provider]  mirabegron ER (MYRBETRIQ) 25 MG TB24 tablet Take 25 mg by mouth daily.   Yes [provider]  acetaminophen (TYLENOL) 325 MG tablet Take 2 tablets (650 mg total) by mouth every 6 (six) hours as needed for moderate pain or headache. 12/15/17   Nira Connardama, Jayleene Glaeser Eduardo, MD                                                                                                                                    Past Surgical History Past Surgical History:  Procedure Laterality Date  . APPENDECTOMY    .  BOTOX INJECTION  12/2005   right neck torticollis/cervical dystonia  . CATARACT EXTRACTION    . COLONOSCOPY  2000  . HEMORRHOID SURGERY     Family History Family History  Problem Relation Age of Onset  . Stroke Mother     Social History Social History   Tobacco Use  . Smoking status: Never Smoker  . Smokeless tobacco: Never Used  Substance Use Topics  . Alcohol use: No  . Drug use: No   Allergies Patient has no known allergies.  Review of Systems Review of Systems All other systems are reviewed and are negative for acute change except as noted in the HPI  Physical Exam Vital Signs  I have reviewed the triage vital signs BP (!) 167/88 (BP Location: Right Arm)   Pulse 83   Temp 97.6 F (36.4 C) (Oral)   Resp 16   Ht 5' (1.524 m)   Wt 37.6 kg (83 lb)   SpO2 96%   BMI 16.21 kg/m   Physical Exam  Constitutional: She is oriented to person, place, and  time. She appears well-developed and well-nourished. No distress.  HENT:  Head: Normocephalic. Head is with contusion.    Right Ear: External ear normal.  Left Ear: External ear normal.  Nose: Nose normal.  Skin tear to the left eyebrow  Eyes: Conjunctivae and EOM are normal. Pupils are equal, round, and reactive to light. Right eye exhibits no discharge. Left eye exhibits no discharge. No scleral icterus.  Neck: Normal range of motion. Neck supple.  Cardiovascular: Normal rate, regular rhythm and normal heart sounds. Exam reveals no gallop and no friction rub.  No murmur heard. Pulses:      Radial pulses are 2+ on the right side, and 2+ on the left side.       Dorsalis pedis pulses are 2+ on the right side, and 2+ on the left side.  Pulmonary/Chest: Effort normal and breath sounds normal. No stridor. No respiratory distress. She has no wheezes.  Abdominal: Soft. She exhibits no distension. There is no tenderness.  Musculoskeletal: She exhibits no edema or tenderness.       Cervical back: She exhibits no bony tenderness.       Thoracic back: She exhibits no bony tenderness.       Lumbar back: She exhibits no bony tenderness.  Clavicles stable. Chest stable to AP/Lat compression. Pelvis stable to Lat compression. No obvious extremity deformity. No chest or abdominal wall contusion.  Neurological: She is alert and oriented to person, place, and time.  Moving all extremities  Skin: Skin is warm and dry. No rash noted. She is not diaphoretic. No erythema.  Psychiatric: She has a normal mood and affect.    ED Results and Treatments Labs (all labs ordered are listed, but only abnormal results are displayed) Labs Reviewed - No data to display  EKG  EKG Interpretation  Date/Time:    Ventricular Rate:    PR Interval:    QRS Duration:   QT Interval:    QTC  Calculation:   R Axis:     Text Interpretation:        Radiology No results found. Pertinent labs & imaging results that were available during my care of the patient were reviewed by me and considered in my medical decision making (see chart for details).  Medications Ordered in ED Medications  LORazepam (ATIVAN) injection 0.5 mg (0.5 mg Intravenous Given 12/15/17 0823)                                                                                                                                    Procedures Procedures  (including critical care time)  Medical Decision Making / ED Course I have reviewed the nursing notes for this encounter and the patient's prior records (if available in EHR or on provided paperwork).    Patient has a MOST form stating comfort care.  Discussed with the patient and healthcare power of attorney's regarding the need for CT scan.  Initially they requested the CT scan out of curiosity to see whether she had any injuries.  Patient was mildly agitated which is a baseline for the patient and required mild chemical sedation.  After further discussions the family opted to withhold any imaging as there would be no intervention pursued.  Wound was dressed.   Patient will be discharged back to her skilled nursing facility.   Final Clinical Impression(s) / ED Diagnoses Final diagnoses:  Fall, initial encounter  Injury of head, initial encounter    Disposition: Discharge  Condition: Good  I have discussed the results, Dx and Tx plan with the patient and family who expressed understanding and agree(s) with the plan. Discharge instructions discussed at great length. The patient and family was given strict return precautions who verbalized understanding of the instructions. No further questions at time of discharge.    ED Discharge Orders        Ordered    acetaminophen (TYLENOL) 325 MG tablet  Every 6 hours PRN     12/15/17 0911       Follow  Up: Kermit Balo, DO [address] [place] [address] [phone]  Schedule an appointment as soon as possible for a visit  As needed    This chart was dictated using voice recognition software.  Despite best efforts to proofread,  errors can occur which can change the documentation meaning.   Nira Conn, MD 12/15/17 [phone]

## 2017-12-15 NOTE — ED Notes (Signed)
Bed: [id number] Expected date:  Expected time:  Means of arrival:  Comments: 89 f fall

## 2017-12-15 NOTE — ED Triage Notes (Signed)
Patient fell on floor and hit her head. Patient was an unwitnessed fall. Patient is alert and oriented. Patient has small bruising on legs and a hematoma on left temporal area.

## 2017-12-16 ENCOUNTER — Encounter: Payer: Self-pay | Admitting: Adult Health

## 2017-12-16 ENCOUNTER — Non-Acute Institutional Stay (SKILLED_NURSING_FACILITY): Payer: Medicare Other | Admitting: Adult Health

## 2017-12-16 DIAGNOSIS — Z7189 Other specified counseling: Secondary | ICD-10-CM

## 2017-12-16 DIAGNOSIS — K121 Other forms of stomatitis: Secondary | ICD-10-CM | POA: Diagnosis not present

## 2017-12-16 DIAGNOSIS — W19XXXA Unspecified fall, initial encounter: Secondary | ICD-10-CM

## 2017-12-16 DIAGNOSIS — S6991XA Unspecified injury of right wrist, hand and finger(s), initial encounter: Secondary | ICD-10-CM | POA: Diagnosis not present

## 2017-12-16 DIAGNOSIS — S0012XA Contusion of left eyelid and periocular area, initial encounter: Secondary | ICD-10-CM

## 2017-12-16 NOTE — Progress Notes (Signed)
Location:  Medical illustrator of Service:  SNF (31) Provider:   Peggye Ley, ANP [facility] [phone]   Kermit Balo, DO  Patient Care Team: Kermit Balo, DO as PCP - General (Geriatric Medicine) Lavonne Chick, [facility]  Extended Emergency Contact Information Primary Emergency Contact: Alcide Evener [place] Home Phone: [phone] Mobile Phone: [phone] Relation: Other Secondary Emergency Contact: Manson Passey [place] of [place] Home Phone: [phone] Work Phone: [phone] Relation: Nephew  Code Status:  DNR Goals of care: Advanced Directive information Advanced Directives 12/15/2017  Does Patient Have a Medical Advance Directive? Yes  Type of Advance Directive Healthcare Power of Attorney  Does patient want to make changes to medical advance directive? No - Patient declined  Copy of Healthcare Power of Attorney in Chart? -  Would patient like information on creating a medical advance directive? No - Patient declined  Pre-existing out of facility DNR order (yellow form or pink MOST form) Yellow form placed in chart (order not valid for inpatient use)     Chief Complaint  Patient presents with  . Acute Visit    f/u fall    HPI:  Pt is a 84 y.o. female seen today for an acute visit for follow up regarding a fall. On 3/3 she was found on the floor with a laceration to the left temple area and was sent to the hospital. She was alert per the notes after the fall but unaware of how it happened. Her family did not want her to have any labs or CT scans and so she was sent back to [facility]. She has not eaten in the past 24 hrs and has been mostly lethargic. She was given 0.5 mg of Ativan in the ER in anticipation for a CT scan so this may have contributed to the lethargy.  Her POA reports that over the weekend prior to the fall she had an episode where her face went blank and she was non  responsive at the dinner table for 10 minutes. There is no further information about this episode in matrix for my review. She has had progressive weakness, poor intake, weight loss, and memory loss over the past few months. She celebrated her 89th birthday in November and has always expressed that she did not want any aggressive measures and does not like to take medication. Upon entering the room she requested to get out of bed and became more alert.  The staff used the stand up lift to transfer her to the chair. She was able to follow commands slowly and answer q's.  She denied visual changes, head aches, or nausea but she did report dizziness.  Of note her right wrist is swollen and bruised for my visit.  She was on dukes mouth wash for oral infection and the dentist recommended pulling 4 teeth but she did not want to pursue this plan. She has had swollen salivary and lymph glands around her mouth. She denies any pain.   Past Medical History:  Diagnosis Date  . Acquired deformity of chest and rib 05/28/2007  . Chest pain, unspecified 12/02/2001  . Cough 08/24/1998  . Edema 03/25/2006  . Fracture of neck of femur (HCC) 03/19/2006  . Internal hemorrhoids without mention of complication 02/13/1999  . Lumbago 02/19/2007  . Osteoporosis, unspecified 03/25/2006  . Pain in limb 12/02/2001  . Primary localized osteoarthrosis, unspecified site 06/05/2006  . Rectal prolapse 07/10/2006  . Scoliosis (and kyphoscoliosis),  idiopathic 05/14/2006  . Seborrhea 10/04/2004  . Torticollis, unspecified 10/04/2004  . Unspecified urinary incontinence 02/28/2000   Past Surgical History:  Procedure Laterality Date  . APPENDECTOMY    . BOTOX INJECTION  12/2005   right neck torticollis/cervical dystonia  . CATARACT EXTRACTION    . COLONOSCOPY  2000  . HEMORRHOID SURGERY      No Known Allergies  Outpatient Encounter Medications as of 12/16/2017  Medication Sig  . acetaminophen (TYLENOL) 325 MG tablet Take 2 tablets  (650 mg total) by mouth every 6 (six) hours as needed for moderate pain or headache.  . hydroxypropyl methylcellulose / hypromellose (ISOPTO TEARS / GONIOVISC) 2.5 % ophthalmic solution Place 1 drop into both eyes as needed for dry eyes.  . mirabegron ER (MYRBETRIQ) 25 MG TB24 tablet Take 25 mg by mouth daily.   No facility-administered encounter medications on file as of 12/16/2017.     Review of Systems  Constitutional: Positive for activity change, appetite change and fatigue. Negative for chills, diaphoresis and fever.  HENT: Positive for postnasal drip. Negative for congestion.   Respiratory: Positive for cough (chronic). Negative for shortness of breath and wheezing.   Cardiovascular: Negative for chest pain, palpitations and leg swelling.  Gastrointestinal: Negative for abdominal distention, constipation and diarrhea.  Genitourinary: Positive for frequency (chronic). Negative for difficulty urinating and dysuria.  Musculoskeletal: Positive for gait problem.  Neurological: Positive for dizziness, tremors and weakness. Negative for seizures, facial asymmetry, speech difficulty, light-headedness, numbness and headaches.  Psychiatric/Behavioral: Positive for confusion. Negative for agitation and behavioral problems.    Immunization History  Administered Date(s) Administered  . Influenza Inj Mdck Quad Pf 08/02/2016  . Influenza Whole 08/14/2013  . Influenza-Unspecified 07/29/2014, 08/04/2015, 08/08/2017  . Zoster 10/16/2011   Pertinent  Health Maintenance Due  Topic Date Due  . PNA vac Low Risk Adult (1 of 2 - PCV13) 08/27/1982  . INFLUENZA VACCINE  Completed  . DEXA SCAN  Discontinued   Fall Risk  03/20/2017  Falls in the past year? No   Functional Status Survey:    Vitals:   12/16/17 1548  BP: (!) 170/80  Pulse: 100  Resp: 20  Temp: 98.1 F (36.7 C)  SpO2: 93%  Weight: 85 lb 9.6 oz (38.8 kg)   Body mass index is 16.72 kg/m. Physical Exam  Constitutional: No  distress.  HENT:  Mouth/Throat: No oropharyngeal exudate.  Clear drainage to both nares. Bruising to left orbit and temple area with laceration closed and covered by bandage. Ulceration to roof of her mouth.   Neck: No JVD present.  Cardiovascular: Normal rate and regular rhythm.  No murmur heard. Pulmonary/Chest: Effort normal and breath sounds normal. No respiratory distress. She has no wheezes.  Abdominal: Soft. Bowel sounds are normal.  Lymphadenopathy:       Head (right side): Submental and submandibular adenopathy present.       Head (left side): Submental and submandibular adenopathy present.  Neurological: She is alert. No cranial nerve deficit.  Oriented to self only. Able to f/c intermittently.   Skin: Skin is warm and dry. She is not diaphoretic.    Labs reviewed: Recent Labs    05/28/17 0848  NA 140  K 3.3*  CL 99*  CO2 29  GLUCOSE 121*  BUN 21*  CREATININE 0.72  CALCIUM 8.9   No results for input(s): AST, ALT, ALKPHOS, BILITOT, PROT, ALBUMIN in the last 8760 hours. Recent Labs    05/28/17 0848  WBC 11.1*  NEUTROABS  8.7*  HGB 12.4  HCT 37.9  MCV 99.2  PLT 195   No results found for: TSH No results found for: HGBA1C No results found for: CHOL, HDL, LDLCALC, LDLDIRECT, TRIG, CHOLHDL  Significant Diagnostic Results in last 30 days:  No results found.  Assessment/Plan   1. Fall, initial encounter Its not clear if there was syncope that precluded this fall.  Her family does not want work up for this and the previous "blackout" episode she had prior to the fall due to her age/debility. Fall precautions are in place and she will have a 1:1 caregiver with her to help prevent falls.   2. Contusion of left periocular region, initial encounter She is becoming more alert which may be in apart due to the fact that the ativan is coming out of her system and she is beginning to recover. Her family has declined neurochecks. She seems to moving all extremities and  does not have a facial droop.    3. Injury of right wrist, initial encounter 2 view xray right wrist  May use ice for swelling but she had declined this previously  4. Oral ulcer Dukes mouth wash 5 ml BID for 7 days  5. Advanced care planning/counseling discussion I discussed her care with her POA and they would like comfort care only to reflect Dr. Hazle CocaBerry's wishes. The staff may offer her food and fluids but should not "force" them if she declines. I offered the family hospice as she would be appropriate but they do not feel they need the service at this time. They would like to avoid unnecessary tests, hospitalizations, etc. I spent 45 minutes with Dr Allyson SabalBerry and her POA regarding this plan.

## 2017-12-17 DIAGNOSIS — M25531 Pain in right wrist: Secondary | ICD-10-CM | POA: Diagnosis not present

## 2017-12-17 DIAGNOSIS — S52551A Other extraarticular fracture of lower end of right radius, initial encounter for closed fracture: Secondary | ICD-10-CM | POA: Diagnosis not present

## 2017-12-19 ENCOUNTER — Telehealth: Payer: Self-pay

## 2017-12-19 NOTE — Telephone Encounter (Signed)
Patient's niece [name] called requesting to speak with [name] to address Boost Order.  Dewayne Hatchnn states the order for Boost is not advisable and is against her aunts wishes. Dewayne Hatchnn states " Every 84 year old human being that does not have an appetite does not want to drink boost."  [name] then proceeded to ask what else has [name] ordered that she is not aware of, for she found out about the boost order through Dr.Murgia's caregiver.  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 [name] agreed that she would call [facility] with other inquires   [name] please advise about boost order

## 2017-12-19 NOTE — Telephone Encounter (Signed)
It's ok to stop the boost.  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  I just thought it was worth a try.

## 2017-12-19 NOTE — Telephone Encounter (Signed)
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

## 2017-12-26 DIAGNOSIS — S52551D Other extraarticular fracture of lower end of right radius, subsequent encounter for closed fracture with routine healing: Secondary | ICD-10-CM | POA: Diagnosis not present

## 2017-12-31 DIAGNOSIS — Z9181 History of falling: Secondary | ICD-10-CM | POA: Diagnosis not present

## 2017-12-31 DIAGNOSIS — R262 Difficulty in walking, not elsewhere classified: Secondary | ICD-10-CM | POA: Diagnosis not present

## 2017-12-31 DIAGNOSIS — R278 Other lack of coordination: Secondary | ICD-10-CM | POA: Diagnosis not present

## 2017-12-31 DIAGNOSIS — M6389 Disorders of muscle in diseases classified elsewhere, multiple sites: Secondary | ICD-10-CM | POA: Diagnosis not present

## 2017-12-31 DIAGNOSIS — R2681 Unsteadiness on feet: Secondary | ICD-10-CM | POA: Diagnosis not present

## 2018-01-01 DIAGNOSIS — Z9181 History of falling: Secondary | ICD-10-CM | POA: Diagnosis not present

## 2018-01-01 DIAGNOSIS — R278 Other lack of coordination: Secondary | ICD-10-CM | POA: Diagnosis not present

## 2018-01-01 DIAGNOSIS — R2681 Unsteadiness on feet: Secondary | ICD-10-CM | POA: Diagnosis not present

## 2018-01-01 DIAGNOSIS — M6389 Disorders of muscle in diseases classified elsewhere, multiple sites: Secondary | ICD-10-CM | POA: Diagnosis not present

## 2018-01-01 DIAGNOSIS — R262 Difficulty in walking, not elsewhere classified: Secondary | ICD-10-CM | POA: Diagnosis not present

## 2018-01-02 DIAGNOSIS — Z9181 History of falling: Secondary | ICD-10-CM | POA: Diagnosis not present

## 2018-01-02 DIAGNOSIS — R278 Other lack of coordination: Secondary | ICD-10-CM | POA: Diagnosis not present

## 2018-01-02 DIAGNOSIS — R262 Difficulty in walking, not elsewhere classified: Secondary | ICD-10-CM | POA: Diagnosis not present

## 2018-01-02 DIAGNOSIS — M6389 Disorders of muscle in diseases classified elsewhere, multiple sites: Secondary | ICD-10-CM | POA: Diagnosis not present

## 2018-01-02 DIAGNOSIS — R2681 Unsteadiness on feet: Secondary | ICD-10-CM | POA: Diagnosis not present

## 2018-01-06 ENCOUNTER — Encounter: Payer: Self-pay | Admitting: Adult Health

## 2018-01-06 ENCOUNTER — Non-Acute Institutional Stay (SKILLED_NURSING_FACILITY): Payer: Medicare Other | Admitting: Adult Health

## 2018-01-06 DIAGNOSIS — K1370 Unspecified lesions of oral mucosa: Secondary | ICD-10-CM

## 2018-01-06 DIAGNOSIS — S060X0D Concussion without loss of consciousness, subsequent encounter: Secondary | ICD-10-CM | POA: Diagnosis not present

## 2018-01-06 DIAGNOSIS — R54 Age-related physical debility: Secondary | ICD-10-CM | POA: Diagnosis not present

## 2018-01-06 DIAGNOSIS — S62101S Fracture of unspecified carpal bone, right wrist, sequela: Secondary | ICD-10-CM

## 2018-01-06 DIAGNOSIS — N3281 Overactive bladder: Secondary | ICD-10-CM | POA: Diagnosis not present

## 2018-01-06 DIAGNOSIS — R269 Unspecified abnormalities of gait and mobility: Secondary | ICD-10-CM

## 2018-01-06 NOTE — Progress Notes (Signed)
[facility]   Place of Service:  SNF (31) Provider:   Peggye Leyhristy Lian Tanori, ANP [facility] [phone]   Kermit Baloeed, [name] L, DO  Patient Care Team: Kermit Baloeed, [name] L, DO as PCP - General (Geriatric Medicine) [facility], [facility]  Extended Emergency Contact Information Primary Emergency Contact: Alcide EvenerSomers,[name]  [place] Home Phone: [phone] Mobile Phone: [phone] Relation: Other Secondary Emergency Contact: Manson PasseyBerry,M.Douglas  [place] Home Phone: [phone] Work Phone: [phone] Relation: Nephew  Code Status:  DNR Goals of care: Advanced Directive information Advanced Directives 12/15/2017  Does Patient Have a Medical Advance Directive? Yes  Type of Advance Directive Healthcare Power of Attorney  Does patient want to make changes to medical advance directive? No - Patient declined  Copy of Healthcare Power of Attorney in Chart? -  Would patient like information on creating a medical advance directive? No - Patient declined  Pre-existing out of facility DNR order (yellow form or pink MOST form) Yellow form placed in chart (order not valid for inpatient use)     Chief Complaint  Patient presents with  . Medical Management of Chronic Issues    HPI:  Pt is a [age] y.o. female seen today for medical management of chronic diseases.    She fell at the beginning of march and suffered a right wrist fracture and concussion. She is currently in a cast. She denies any numbness or tingling to her right fingers. She does have some wrist pain and uses tylenol 1-2 x a day. Currently she is working with therapy regarding ambulation with a walker/arm support. She hit her head during the fall and suffered a concussion. Denies any dizziness, headaches, or nausea. Has poor vision related to age.  12/17/17: right wrist xray Acutely mildly displace impacted fracture distal radial metaphysis and ulnar styloid  fracture  She reports a painful blister to the left side of her lip  Her weight has trended upward by 2 lbs from last month. She overall eats poorly but feels her appetite has improved.    Past Medical History:  Diagnosis Date  . Acquired deformity of chest and rib 05/28/2007  . Chest pain, unspecified 12/02/2001  . Cough 08/24/1998  . Edema 03/25/2006  . Fracture of neck of femur (HCC) 03/19/2006  . Internal hemorrhoids without mention of complication 02/13/1999  . Lumbago 02/19/2007  . Osteoporosis, unspecified 03/25/2006  . Pain in limb 12/02/2001  . Primary localized osteoarthrosis, unspecified site 06/05/2006  . Rectal prolapse 07/10/2006  . Scoliosis (and kyphoscoliosis), idiopathic 05/14/2006  . Seborrhea 10/04/2004  . Torticollis, unspecified 10/04/2004  . Unspecified urinary incontinence 02/28/2000   Past Surgical History:  Procedure Laterality Date  . APPENDECTOMY    . BOTOX INJECTION  12/2005   right neck torticollis/cervical dystonia  . CATARACT EXTRACTION    . COLONOSCOPY  2000  . HEMORRHOID SURGERY      No Known Allergies  Outpatient Encounter Medications as of 01/06/2018  Medication Sig  . acetaminophen (TYLENOL) 325 MG tablet Take 2 tablets (650 mg total) by mouth every 6 (six) hours as needed for moderate pain or headache.  . hydroxypropyl methylcellulose / hypromellose (ISOPTO TEARS / GONIOVISC) 2.5 % ophthalmic solution Place 1 drop into both eyes as needed for dry eyes.  . mirabegron ER (MYRBETRIQ) 25 MG TB24 tablet Take 25 mg by mouth daily.   No facility-administered encounter medications on file as of 01/06/2018.     Review of Systems  Constitutional: Negative  for activity change, appetite change, chills, diaphoresis, fatigue, fever and unexpected weight change.  HENT: Negative for congestion.   Respiratory: Negative for cough, shortness of breath and wheezing.   Cardiovascular: Negative for chest pain, palpitations and leg swelling.  Gastrointestinal:  Negative for abdominal distention, abdominal pain, constipation and diarrhea.  Genitourinary: Negative for difficulty urinating and dysuria.  Musculoskeletal: Positive for arthralgias and gait problem. Negative for back pain, joint swelling and myalgias.  Neurological: Positive for tremors. Negative for dizziness, seizures, syncope, facial asymmetry, speech difficulty, weakness, light-headedness, numbness and headaches.  Psychiatric/Behavioral: Negative for agitation and behavioral problems.       Memory loss    Immunization History  Administered Date(s) Administered  . Influenza Inj Mdck Quad Pf 08/02/2016  . Influenza Whole 08/14/2013  . Influenza-Unspecified 07/29/2014, 08/04/2015, 08/08/2017  . Zoster 10/16/2011   Pertinent  Health Maintenance Due  Topic Date Due  . PNA vac Low Risk Adult (1 of 2 - PCV13) 08/27/1982  . INFLUENZA VACCINE  Completed  . DEXA SCAN  Discontinued   Fall Risk  03/20/2017  Falls in the past year? No   Functional Status Survey:   Wt Readings from Last 3 Encounters:  01/06/18 85 lb 9.6 oz (38.8 kg)  12/16/17 85 lb 9.6 oz (38.8 kg)  12/15/17 83 lb (37.6 kg)    Vitals:   01/06/18 1634  Weight: 85 lb 9.6 oz (38.8 kg)   Body mass index is 16.72 kg/m. Physical Exam  Constitutional: She is oriented to person, place, and time. No distress.  Cardiovascular: Normal rate and regular rhythm.  Pulmonary/Chest: Effort normal and breath sounds normal.  Abdominal: Soft. Bowel sounds are normal.  Musculoskeletal: She exhibits tenderness. She exhibits no edema.  Cast to right forearm, +CMS noted  Neurological: She is alert and oriented to person, place, and time.  Skin: Skin is warm and dry. She is not diaphoretic.  Psychiatric: She has a normal mood and affect.  Nursing note and vitals reviewed.   Labs reviewed: Recent Labs    05/28/17 0848  NA 140  K 3.3*  CL 99*  CO2 29  GLUCOSE 121*  BUN 21*  CREATININE 0.72  CALCIUM 8.9   No results for  input(s): AST, ALT, ALKPHOS, BILITOT, PROT, ALBUMIN in the last 8760 hours. Recent Labs    05/28/17 0848  WBC 11.1*  NEUTROABS 8.7*  HGB 12.4  HCT 37.9  MCV 99.2  PLT 195   No results found for: TSH No results found for: HGBA1C No results found for: CHOL, HDL, LDLCALC, LDLDIRECT, TRIG, CHOLHDL  Significant Diagnostic Results in last 30 days:  No results found.  Assessment/Plan  Wrist fracture, closed, right, sequela Mildly displace with styloid fracture. Currently in a cast. Follow up with ortho as indicated. Continue tylenol for pain.   Gait abnormality Continue physical therapy for gait training with new walker with arm support  Frailty syndrome in geriatric patient Requires skilled care due to memory loss, loss of executive function, and gait abnormality. Weight is trending upward. Remains with low appetite but she reports some improvement in this area.  OAB (overactive bladder) Controlled, continue myrbetriq.  Oral lesion Apply abreva 3 times a day for 3 days.  Concussion No further symptoms. Much more alert and conversing with staff.    Family/ staff Communication: discussed with resident  Labs/tests ordered:  NA   Abreva

## 2018-01-07 DIAGNOSIS — R269 Unspecified abnormalities of gait and mobility: Secondary | ICD-10-CM | POA: Insufficient documentation

## 2018-01-07 DIAGNOSIS — R2681 Unsteadiness on feet: Secondary | ICD-10-CM | POA: Diagnosis not present

## 2018-01-07 DIAGNOSIS — R262 Difficulty in walking, not elsewhere classified: Secondary | ICD-10-CM | POA: Diagnosis not present

## 2018-01-07 DIAGNOSIS — S060XAA Concussion with loss of consciousness status unknown, initial encounter: Secondary | ICD-10-CM | POA: Insufficient documentation

## 2018-01-07 DIAGNOSIS — R278 Other lack of coordination: Secondary | ICD-10-CM | POA: Diagnosis not present

## 2018-01-07 DIAGNOSIS — K1370 Unspecified lesions of oral mucosa: Secondary | ICD-10-CM | POA: Insufficient documentation

## 2018-01-07 DIAGNOSIS — Z9181 History of falling: Secondary | ICD-10-CM | POA: Diagnosis not present

## 2018-01-07 DIAGNOSIS — M6389 Disorders of muscle in diseases classified elsewhere, multiple sites: Secondary | ICD-10-CM | POA: Diagnosis not present

## 2018-01-07 DIAGNOSIS — S62101S Fracture of unspecified carpal bone, right wrist, sequela: Secondary | ICD-10-CM | POA: Insufficient documentation

## 2018-01-07 DIAGNOSIS — S060X9A Concussion with loss of consciousness of unspecified duration, initial encounter: Secondary | ICD-10-CM | POA: Insufficient documentation

## 2018-01-07 NOTE — Assessment & Plan Note (Signed)
Continue physical therapy for gait training with new walker with arm support

## 2018-01-07 NOTE — Assessment & Plan Note (Signed)
Requires skilled care due to memory loss, loss of executive function, and gait abnormality. Weight is trending upward. Remains with low appetite but she reports some improvement in this area.

## 2018-01-07 NOTE — Assessment & Plan Note (Signed)
Apply abreva 3 times a day for 3 days.

## 2018-01-07 NOTE — Assessment & Plan Note (Signed)
No further symptoms. Much more alert and conversing with staff.

## 2018-01-07 NOTE — Assessment & Plan Note (Signed)
Mildly displace with styloid fracture. Currently in a cast. Follow up with ortho as indicated. Continue tylenol for pain.

## 2018-01-07 NOTE — Assessment & Plan Note (Signed)
Controlled, continue myrbetriq 

## 2018-01-10 DIAGNOSIS — R2681 Unsteadiness on feet: Secondary | ICD-10-CM | POA: Diagnosis not present

## 2018-01-10 DIAGNOSIS — R278 Other lack of coordination: Secondary | ICD-10-CM | POA: Diagnosis not present

## 2018-01-10 DIAGNOSIS — Z9181 History of falling: Secondary | ICD-10-CM | POA: Diagnosis not present

## 2018-01-10 DIAGNOSIS — M6389 Disorders of muscle in diseases classified elsewhere, multiple sites: Secondary | ICD-10-CM | POA: Diagnosis not present

## 2018-01-10 DIAGNOSIS — R262 Difficulty in walking, not elsewhere classified: Secondary | ICD-10-CM | POA: Diagnosis not present

## 2018-01-27 DIAGNOSIS — S52551D Other extraarticular fracture of lower end of right radius, subsequent encounter for closed fracture with routine healing: Secondary | ICD-10-CM | POA: Diagnosis not present

## 2018-02-06 ENCOUNTER — Non-Acute Institutional Stay (SKILLED_NURSING_FACILITY): Payer: Medicare Other | Admitting: Adult Health

## 2018-02-06 DIAGNOSIS — R1310 Dysphagia, unspecified: Secondary | ICD-10-CM | POA: Diagnosis not present

## 2018-02-06 DIAGNOSIS — R269 Unspecified abnormalities of gait and mobility: Secondary | ICD-10-CM

## 2018-02-06 DIAGNOSIS — S62101S Fracture of unspecified carpal bone, right wrist, sequela: Secondary | ICD-10-CM | POA: Diagnosis not present

## 2018-02-06 DIAGNOSIS — R54 Age-related physical debility: Secondary | ICD-10-CM | POA: Diagnosis not present

## 2018-02-06 DIAGNOSIS — R599 Enlarged lymph nodes, unspecified: Secondary | ICD-10-CM | POA: Diagnosis not present

## 2018-02-06 DIAGNOSIS — K117 Disturbances of salivary secretion: Secondary | ICD-10-CM

## 2018-02-06 DIAGNOSIS — R413 Other amnesia: Secondary | ICD-10-CM

## 2018-02-08 ENCOUNTER — Encounter: Payer: Self-pay | Admitting: Adult Health

## 2018-02-08 DIAGNOSIS — R599 Enlarged lymph nodes, unspecified: Secondary | ICD-10-CM | POA: Insufficient documentation

## 2018-02-08 NOTE — Assessment & Plan Note (Signed)
Improving weight and function since her fall

## 2018-02-08 NOTE — Assessment & Plan Note (Signed)
Progressive but rather functional. No memory meds due to resident request, age, and skilled care status.

## 2018-02-08 NOTE — Assessment & Plan Note (Signed)
resolved 

## 2018-02-08 NOTE — Assessment & Plan Note (Signed)
Healing, continues to wear wrist splint for support No f/u indicated from ortho.

## 2018-02-08 NOTE — Assessment & Plan Note (Signed)
Should chew well with each swallow and remain upright during and after meals.

## 2018-02-08 NOTE — Progress Notes (Signed)
Location:  Medical illustrator of Service:  SNF (31) Provider:   Peggye Ley, ANP [facility] [phone]   Kermit Balo, DO  Patient Care Team: Kermit Balo, DO as PCP - General (Geriatric Medicine) Lavonne Chick, [facility]  Extended Emergency Contact Information Primary Emergency Contact: Alcide Evener [place] Home Phone: [phone] Mobile Phone: [phone] Relation: Other Secondary Emergency Contact: Manson Passey [place] Home Phone: [phone] Work Phone: [phone] Relation: Nephew  Code Status:  DNR Goals of care: Advanced Directive information Advanced Directives 12/15/2017  Does Patient Have a Medical Advance Directive? Yes  Type of Advance Directive Healthcare Power of Attorney  Does patient want to make changes to medical advance directive? No - Patient declined  Copy of Healthcare Power of Attorney in Chart? -  Would patient like information on creating a medical advance directive? No - Patient declined  Pre-existing out of facility DNR order (yellow form or pink MOST form) Yellow form placed in chart (order not valid for inpatient use)     Chief Complaint  Patient presents with  . Medical Management of Chronic Issues    HPI:  Pt is a 84 y.o. female seen today for medical management of chronic diseases.  She resides in skilled care due to functional losses.   Wrist fracture on the right due to fall: cast off, now in brace Not having much pain. Uses walker with arm rest and feels she is doing well 12/17/17: right wrist xray Acutely mildly displace impacted fracture distal radial metaphysis and ulnar styloid fracture  She has had improved appetite and her weight is trending upward Wt Readings from Last 3 Encounters:  02/06/18 87 lb 3.2 oz (39.6 kg)  01/06/18 85 lb 9.6 oz (38.8 kg)  12/16/17 85 lb 9.6 oz (38.8 kg)   Memory loss: noted progressive but remains  verbal, pleasant, able to f/c and communicate her needs MMSE 25/30  Dysphagia: has a hx of choking but is on a regular diet with thin liq per her request for QOL issues She denies any recent issues with choking, or difficulty chewing her food  She continues with swollen lymph nodes but denies any further dental pain. She was treated for an oral infection and it was recommended that she have teeth pulled but she declined due to her age. Previous excessive saliva gone.   Past Medical History:  Diagnosis Date  . Acquired deformity of chest and rib 05/28/2007  . Chest pain, unspecified 12/02/2001  . Cough 08/24/1998  . Edema 03/25/2006  . Fracture of neck of femur (HCC) 03/19/2006  . Internal hemorrhoids without mention of complication 02/13/1999  . Lumbago 02/19/2007  . Osteoporosis, unspecified 03/25/2006  . Pain in limb 12/02/2001  . Primary localized osteoarthrosis, unspecified site 06/05/2006  . Rectal prolapse 07/10/2006  . Scoliosis (and kyphoscoliosis), idiopathic 05/14/2006  . Seborrhea 10/04/2004  . Torticollis, unspecified 10/04/2004  . Unspecified urinary incontinence 02/28/2000   Past Surgical History:  Procedure Laterality Date  . APPENDECTOMY    . BOTOX INJECTION  12/2005   right neck torticollis/cervical dystonia  . CATARACT EXTRACTION    . COLONOSCOPY  2000  . HEMORRHOID SURGERY      No Known Allergies  Outpatient Encounter Medications as of 02/06/2018  Medication Sig  . acetaminophen (TYLENOL) 325 MG tablet Take 2 tablets (650 mg total) by mouth every 6 (six) hours as needed for moderate pain or headache.  . hydroxypropyl methylcellulose /  hypromellose (ISOPTO TEARS / GONIOVISC) 2.5 % ophthalmic solution Place 1 drop into both eyes as needed for dry eyes.  . mirabegron ER (MYRBETRIQ) 25 MG TB24 tablet Take 25 mg by mouth daily.   No facility-administered encounter medications on file as of 02/06/2018.     Review of Systems  Constitutional: Negative for activity change,  appetite change, chills, diaphoresis, fatigue, fever and unexpected weight change.  HENT: Negative for congestion.   Respiratory: Negative for cough, shortness of breath and wheezing.   Cardiovascular: Negative for chest pain, palpitations and leg swelling.  Gastrointestinal: Negative for abdominal distention, abdominal pain, constipation and diarrhea.  Genitourinary: Negative for difficulty urinating and dysuria.  Musculoskeletal: Positive for arthralgias and gait problem. Negative for back pain, joint swelling and myalgias.  Neurological: Positive for tremors. Negative for dizziness, seizures, syncope, facial asymmetry, speech difficulty, weakness, light-headedness, numbness and headaches.  Psychiatric/Behavioral: Positive for confusion. Negative for agitation and behavioral problems.    Immunization History  Administered Date(s) Administered  . Influenza Inj Mdck Quad Pf 08/02/2016  . Influenza Whole 08/14/2013  . Influenza-Unspecified 07/29/2014, 08/04/2015, 08/08/2017  . Zoster 10/16/2011   Pertinent  Health Maintenance Due  Topic Date Due  . PNA vac Low Risk Adult (1 of 2 - PCV13) 08/27/1982  . INFLUENZA VACCINE  05/15/2018  . DEXA SCAN  Discontinued   Fall Risk  03/20/2017  Falls in the past year? No   Functional Status Survey:    Vitals:   02/06/18 1148  BP: (!) 148/73  Pulse: 82  Resp: 19  Temp: 98.1 F (36.7 C)  SpO2: 94%  Weight: 87 lb 3.2 oz (39.6 kg)   Body mass index is 17.03 kg/m. Physical Exam  Constitutional: She is oriented to person, place, and time. No distress.  Very frail and thin  HENT:  Head: Normocephalic and atraumatic.  Neck: No JVD present.  Adenopathy to submental and submandibular area  Cardiovascular: Normal rate and regular rhythm.  No murmur heard. Pulmonary/Chest: Effort normal and breath sounds normal. No respiratory distress. She has no wheezes.  Abdominal: Soft. Bowel sounds are normal.  Musculoskeletal: She exhibits no edema,  tenderness or deformity.  Right wrist with brace in place. +CMS  Neurological: She is alert and oriented to person, place, and time.  Skin: Skin is warm and dry. She is not diaphoretic.  Psychiatric: She has a normal mood and affect.  Nursing note and vitals reviewed.   Labs reviewed: Recent Labs    05/28/17 0848  NA 140  K 3.3*  CL 99*  CO2 29  GLUCOSE 121*  BUN 21*  CREATININE 0.72  CALCIUM 8.9   No results for input(s): AST, ALT, ALKPHOS, BILITOT, PROT, ALBUMIN in the last 8760 hours. Recent Labs    05/28/17 0848  WBC 11.1*  NEUTROABS 8.7*  HGB 12.4  HCT 37.9  MCV 99.2  PLT 195   No results found for: TSH No results found for: HGBA1C No results found for: CHOL, HDL, LDLCALC, LDLDIRECT, TRIG, CHOLHDL  Significant Diagnostic Results in last 30 days:  No results found.  Assessment/Plan  Wrist fracture, closed, right, sequela Healing, continues to wear wrist splint for support No f/u indicated from ortho.  Dysphagia Should chew well with each swallow and remain upright during and after meals.  Gait abnormality Using walker with arm rest to ambulate to the bathroom but otherwise spends her time in the chair.  Memory loss Progressive but rather functional. No memory meds due to resident request, age,  and skilled care status.  Sialorrhea resolved  Enlarged lymph nodes Noted to submental and submandibular area. No pain or tenderness. ? If this is due to dental issue?  Resident does not want further treatment.   Frailty syndrome in geriatric patient Improving weight and function since her fall    Family/ staff Communication: discussed with resident  Labs/tests ordered:  NA

## 2018-02-08 NOTE — Assessment & Plan Note (Signed)
Noted to submental and submandibular area. No pain or tenderness. ? If this is due to dental issue?  Resident does not want further treatment.

## 2018-02-08 NOTE — Assessment & Plan Note (Signed)
Using walker with arm rest to ambulate to the bathroom but otherwise spends her time in the chair.

## 2018-02-26 DIAGNOSIS — S52551D Other extraarticular fracture of lower end of right radius, subsequent encounter for closed fracture with routine healing: Secondary | ICD-10-CM | POA: Diagnosis not present

## 2018-03-11 ENCOUNTER — Encounter: Payer: Self-pay | Admitting: Internal Medicine

## 2018-03-11 ENCOUNTER — Non-Acute Institutional Stay (SKILLED_NURSING_FACILITY): Payer: Medicare Other | Admitting: Internal Medicine

## 2018-03-11 DIAGNOSIS — R269 Unspecified abnormalities of gait and mobility: Secondary | ICD-10-CM

## 2018-03-11 DIAGNOSIS — R1310 Dysphagia, unspecified: Secondary | ICD-10-CM

## 2018-03-11 DIAGNOSIS — M810 Age-related osteoporosis without current pathological fracture: Secondary | ICD-10-CM

## 2018-03-11 DIAGNOSIS — R54 Age-related physical debility: Secondary | ICD-10-CM

## 2018-03-11 DIAGNOSIS — N3281 Overactive bladder: Secondary | ICD-10-CM

## 2018-03-11 DIAGNOSIS — R413 Other amnesia: Secondary | ICD-10-CM | POA: Diagnosis not present

## 2018-03-11 NOTE — Progress Notes (Signed)
Patient ID: [name], female   DOB: 06-03-1915, 84 y.o.   MRN: [id number]  Location:  [facility] Room Number: 142 Place of Service:  SNF ([phone]) Provider:   Kermit Balo, DO  Patient Care Team: Kermit Balo, DO as PCP - General (Geriatric Medicine) [facility], [facility]  Extended Emergency Contact Information Primary Emergency Contact: Alcide Evener [place] Home Phone: [phone] Mobile Phone: [phone] Relation: Other Secondary Emergency Contact: Manson Passey [place] Home Phone: [phone] Work Phone: [phone] Relation: Nephew  Code Status:  DNR Goals of care: Advanced Directive information Advanced Directives 03/11/2018  Does Patient Have a Medical Advance Directive? Yes  Type of Estate agent of [place];Out of facility DNR (pink MOST or yellow form)  Does patient want to make changes to medical advance directive? No - Patient declined  Copy of Healthcare Power of Attorney in Chart? Yes  Would patient like information on creating a medical advance directive? -  Pre-existing out of facility DNR order (yellow form or pink MOST form) Yellow form placed in chart (order not valid for inpatient use);Pink MOST form placed in chart (order not valid for inpatient use)     Chief Complaint  Patient presents with  . Medical Management of Chronic Issues    routine visit    HPI:  Pt is a 84 y.o. female seen today for medical management of chronic diseases.  Unfortunately, she fell last night.  She has overactive bladder and does not like to wait for staff to come help her to the restroom b/c of her level of urgency.  She is on myrbetriq which did significantly reduce the frequency of her urination per staff.    She sustained no major injuries during this fall.  She uses a walker.  She denies pain.    BP was 161/76 and had not been rechecked today.  She's not on  bp meds and this appears to have been in the midst of her fall.    She has her chronic cough for which she uses multiple lozenges through the day--it does not respond to cough syrup or tessalone perles (suspect related to lung mass that she's opted not to have further evaluation or biopsy for).  Weight had trended down to a low of 83 in march, but is back up to 87 now.  Intake is not impressive.    She continues to enjoy puzzle books to keep her mind stimulated.  She can no longer see well enough to do her computer work she enjoyed.    Past Medical History:  Diagnosis Date  . Acquired deformity of chest and rib 05/28/2007  . Chest pain, unspecified 12/02/2001  . Cough 08/24/1998  . Edema 03/25/2006  . Fracture of neck of femur (HCC) 03/19/2006  . Internal hemorrhoids without mention of complication 02/13/1999  . Lumbago 02/19/2007  . Osteoporosis, unspecified 03/25/2006  . Pain in limb 12/02/2001  . Primary localized osteoarthrosis, unspecified site 06/05/2006  . Rectal prolapse 07/10/2006  . Scoliosis (and kyphoscoliosis), idiopathic 05/14/2006  . Seborrhea 10/04/2004  . Torticollis, unspecified 10/04/2004  . Unspecified urinary incontinence 02/28/2000   Past Surgical History:  Procedure Laterality Date  . APPENDECTOMY    . BOTOX INJECTION  12/2005   right neck torticollis/cervical dystonia  . CATARACT EXTRACTION    . COLONOSCOPY  2000  . HEMORRHOID SURGERY      No Known Allergies  Outpatient Encounter Medications as of 03/11/2018  Medication Sig  . acetaminophen (TYLENOL) 325 MG tablet Take 2 tablets (650 mg total) by mouth every 6 (six) hours as needed for moderate pain or headache.  . hydroxypropyl methylcellulose / hypromellose (ISOPTO TEARS / GONIOVISC) 2.5 % ophthalmic solution Place 1 drop into both eyes as needed for dry eyes.  . mirabegron ER (MYRBETRIQ) 25 MG TB24 tablet Take 25 mg by mouth daily.   No facility-administered encounter medications on file as of 03/11/2018.      Review of Systems  Constitutional: Positive for appetite change and fatigue. Negative for activity change, chills, fever and unexpected weight change.  HENT: Negative for congestion.   Respiratory: Positive for cough. Negative for shortness of breath and wheezing.   Cardiovascular: Negative for chest pain, palpitations and leg swelling.       Compression hose  Gastrointestinal: Negative for abdominal pain, constipation and diarrhea.  Genitourinary: Positive for frequency and urgency. Negative for dysuria.  Musculoskeletal: Positive for gait problem. Negative for back pain and joint swelling.  Neurological: Positive for weakness. Negative for dizziness.  Hematological: Bruises/bleeds easily.  Psychiatric/Behavioral: Positive for confusion.    Immunization History  Administered Date(s) Administered  . Influenza Inj Mdck Quad Pf 08/02/2016  . Influenza Whole 08/14/2013  . Influenza-Unspecified 07/29/2014, 08/04/2015, 08/08/2017  . Zoster 10/16/2011   Pertinent  Health Maintenance Due  Topic Date Due  . PNA vac Low Risk Adult (1 of 2 - PCV13) 08/27/1982  . INFLUENZA VACCINE  05/15/2018  . DEXA SCAN  Discontinued   Fall Risk  03/20/2017  Falls in the past year? No   Functional Status Survey:    Vitals:   03/11/18 1101  BP: (!) 161/76  Pulse: 76  Resp: 18  Temp: (!) 97.5 F (36.4 C)  TempSrc: Oral  SpO2: 93%  Weight: 87 lb (39.5 kg)  Height: 5' (1.524 m)   Body mass index is 16.99 kg/m. Physical Exam  Constitutional: She is oriented to person, place, and time.  Frail female resting in recliner chair in her room  HENT:  Head: Normocephalic and atraumatic.  Cardiovascular: Normal rate, regular rhythm, normal heart sounds and intact distal pulses.  Pulmonary/Chest: Effort normal and breath sounds normal. No respiratory distress.  Abdominal: Soft. Bowel sounds are normal. She exhibits no distension. There is no tenderness. There is no rebound and no guarding.   Musculoskeletal: Normal range of motion. She exhibits no tenderness.  Neurological: She is alert and oriented to person, place, and time.  Skin: Skin is warm and dry.  Psychiatric: She has a normal mood and affect.    Labs reviewed: Recent Labs    05/28/17 0848  NA 140  K 3.3*  CL 99*  CO2 29  GLUCOSE 121*  BUN 21*  CREATININE 0.72  CALCIUM 8.9   No results for input(s): AST, ALT, ALKPHOS, BILITOT, PROT, ALBUMIN in the last 8760 hours. Recent Labs    05/28/17 0848  WBC 11.1*  NEUTROABS 8.7*  HGB 12.4  HCT 37.9  MCV 99.2  PLT 195   Assessment/Plan 1. Gait abnormality -with fall last night -cont use of walker at all times and try to wait for help for trips to bathroom  2. Frailty syndrome in geriatric patient -progressing  3. Memory loss -progressing also, No flowsheet data found.--mmse not on file for some reason  4. Dysphagia, unspecified type -big contributor to cough along with suspected mass against nerve   5. Senile osteoporosis -pt has always refused eval and tx for this  6. Overactive bladder -cont myrbetriq which has slowed frequency, still having urgency and some incontinence nonetheless  Family/ staff Communication: discussed with SNF nurse  Labs/tests ordered:  No new  Willa Brocks L. Adler Chartrand, D.O. Geriatrics [facility] [address], [place] [address] Cell Phone (Mon-Fri 8am-5pm):  [phone] On Call:  [phone] & follow prompts after 5pm & weekends Office Phone:  [phone] Office Fax:  [phone]

## 2018-04-09 DIAGNOSIS — H353132 Nonexudative age-related macular degeneration, bilateral, intermediate dry stage: Secondary | ICD-10-CM | POA: Diagnosis not present

## 2018-04-09 DIAGNOSIS — Z961 Presence of intraocular lens: Secondary | ICD-10-CM | POA: Diagnosis not present

## 2018-04-09 DIAGNOSIS — H524 Presbyopia: Secondary | ICD-10-CM | POA: Diagnosis not present

## 2018-04-10 ENCOUNTER — Non-Acute Institutional Stay (SKILLED_NURSING_FACILITY): Payer: Medicare Other | Admitting: Adult Health

## 2018-04-10 ENCOUNTER — Encounter: Payer: Self-pay | Admitting: Adult Health

## 2018-04-10 DIAGNOSIS — R54 Age-related physical debility: Secondary | ICD-10-CM | POA: Diagnosis not present

## 2018-04-10 DIAGNOSIS — S62101S Fracture of unspecified carpal bone, right wrist, sequela: Secondary | ICD-10-CM | POA: Diagnosis not present

## 2018-04-10 DIAGNOSIS — N3281 Overactive bladder: Secondary | ICD-10-CM | POA: Diagnosis not present

## 2018-04-10 DIAGNOSIS — R1310 Dysphagia, unspecified: Secondary | ICD-10-CM | POA: Diagnosis not present

## 2018-04-10 DIAGNOSIS — R413 Other amnesia: Secondary | ICD-10-CM

## 2018-04-10 NOTE — Assessment & Plan Note (Signed)
Continue myrbetriq 25 mg qhs

## 2018-04-10 NOTE — Assessment & Plan Note (Addendum)
Low weight, 84 years old, high fall risk Needs assistance for any activity to prevent falls Does not want to take nutritional supplements

## 2018-04-10 NOTE — Assessment & Plan Note (Signed)
Denies any issues chewing or swallowing. Continues on a regular diet per pt request (has waiver)

## 2018-04-10 NOTE — Assessment & Plan Note (Signed)
Progressive decline in short term memory loss Supportive care only due to goals of care

## 2018-04-10 NOTE — Progress Notes (Signed)
[facility]   Place of Service:   SNF Provider:   Peggye Leyhristy Kandy Towery, ANP [facility] [phone]   Kermit Baloeed, [name] L, DO  Patient Care Team: Kermit Baloeed, [name] L, DO as PCP - General (Geriatric Medicine) Lavonne Chickommunity, [facility]  Extended Emergency Contact Information Primary Emergency Contact: Alcide EvenerSomers,[name]  [place] Home Phone: [phone] Mobile Phone: [phone] Relation: Other Secondary Emergency Contact: Manson PasseyBerry,M.Douglas  [place] Home Phone: [phone] Work Phone: [phone] Relation: Nephew  Code Status:  DNR Goals of care: Advanced Directive information Advanced Directives 03/11/2018  Does Patient Have a Medical Advance Directive? Yes  Type of Estate agentAdvance Directive Healthcare Power of [place];Out of facility DNR (pink MOST or yellow form)  Does patient want to make changes to medical advance directive? No - Patient declined  Copy of Healthcare Power of Attorney in Chart? Yes  Would patient like information on creating a medical advance directive? -  Pre-existing out of facility DNR order (yellow form or pink MOST form) Yellow form placed in chart (order not valid for inpatient use);Pink MOST form placed in chart (order not valid for inpatient use)     Chief Complaint  Patient presents with  . Medical Management of Chronic Issues    HPI:  Pt is a 84 y.o. female seen today for medical management of chronic diseases.    She has a hx of displaced distal radial fx in March after a fall Wrist splint discontinue in May. She denies any pain, numbness, or tingling in her wrist.   Memory loss: continues to work on word puzzles, remains verbal, oriented, and able to communicate her needs but noted progressive short term memory loss MMSE 25/30 07/04/17  OAB: frequency since she was a child that is unchanged per pt No dysuria or pain  She reports that she is ready to pass at any time and only  wants comfort care  Functional status: walks very short distances with a walker, uses the wheelchair most of the time. Continent most of the time per resident  Past Medical History:  Diagnosis Date  . Acquired deformity of chest and rib 05/28/2007  . Chest pain, unspecified 12/02/2001  . Cough 08/24/1998  . Edema 03/25/2006  . Fracture of neck of femur (HCC) 03/19/2006  . Internal hemorrhoids without mention of complication 02/13/1999  . Lumbago 02/19/2007  . Osteoporosis, unspecified 03/25/2006  . Pain in limb 12/02/2001  . Primary localized osteoarthrosis, unspecified site 06/05/2006  . Rectal prolapse 07/10/2006  . Scoliosis (and kyphoscoliosis), idiopathic 05/14/2006  . Seborrhea 10/04/2004  . Torticollis, unspecified 10/04/2004  . Unspecified urinary incontinence 02/28/2000   Past Surgical History:  Procedure Laterality Date  . APPENDECTOMY    . BOTOX INJECTION  12/2005   right neck torticollis/cervical dystonia  . CATARACT EXTRACTION    . COLONOSCOPY  2000  . HEMORRHOID SURGERY      No Known Allergies  Outpatient Encounter Medications as of 04/10/2018  Medication Sig  . acetaminophen (TYLENOL) 325 MG tablet Take 2 tablets (650 mg total) by mouth every 6 (six) hours as needed for moderate pain or headache.  . hydroxypropyl methylcellulose / hypromellose (ISOPTO TEARS / GONIOVISC) 2.5 % ophthalmic solution Place 1 drop into both eyes as needed for dry eyes.  . mirabegron ER (MYRBETRIQ) 25 MG TB24 tablet Take 25 mg by mouth daily.   No facility-administered encounter medications on file as of 04/10/2018.     Review of Systems  Constitutional: Negative for  activity change, appetite change, chills, diaphoresis, fatigue, fever and unexpected weight change.  HENT: Negative for congestion.   Respiratory: Negative for cough, shortness of breath and wheezing.   Cardiovascular: Negative for chest pain, palpitations and leg swelling.  Gastrointestinal: Negative for abdominal distention,  abdominal pain, constipation and diarrhea.  Genitourinary: Positive for frequency. Negative for difficulty urinating and dysuria.  Musculoskeletal: Positive for gait problem. Negative for arthralgias, back pain, joint swelling and myalgias.  Neurological: Positive for tremors. Negative for dizziness, seizures, syncope, facial asymmetry, speech difficulty, weakness, light-headedness, numbness and headaches.  Psychiatric/Behavioral: Negative for agitation, behavioral problems and confusion.       Memory loss    Immunization History  Administered Date(s) Administered  . Influenza Inj Mdck Quad Pf 08/02/2016  . Influenza Whole 08/14/2013  . Influenza-Unspecified 07/29/2014, 08/04/2015, 08/08/2017  . Zoster 10/16/2011   Pertinent  Health Maintenance Due  Topic Date Due  . PNA vac Low Risk Adult (1 of 2 - PCV13) 08/27/1982  . INFLUENZA VACCINE  05/15/2018  . DEXA SCAN  Discontinued   Fall Risk  03/20/2017  Falls in the past year? No   Functional Status Survey:    Vitals:   04/10/18 1127  BP: 140/80  Pulse: 80  Resp: 18  Temp: 97.9 F (36.6 C)  SpO2: 95%  Weight: 88 lb 9.6 oz (40.2 kg)   Body mass index is 17.3 kg/m. Physical Exam  Constitutional: No distress.  Thin and frail  HENT:  Head: Normocephalic and atraumatic.  Mouth/Throat: No oropharyngeal exudate.  Poor dentition  Neck: No JVD present.  Bilateral enlarged sub mandibular glands, not tender  Cardiovascular: Normal rate and regular rhythm.  Murmur heard. Pulmonary/Chest: Effort normal and breath sounds normal. No respiratory distress. She has no wheezes.  Abdominal: Soft. Bowel sounds are normal.  Neurological: She is alert.  Skin: Skin is warm and dry. She is not diaphoretic.  Psychiatric: She has a normal mood and affect.  Nursing note and vitals reviewed.   Labs reviewed: Recent Labs    05/28/17 0848  NA 140  K 3.3*  CL 99*  CO2 29  GLUCOSE 121*  BUN 21*  CREATININE 0.72  CALCIUM 8.9   No  results for input(s): AST, ALT, ALKPHOS, BILITOT, PROT, ALBUMIN in the last 8760 hours. Recent Labs    05/28/17 0848  WBC 11.1*  NEUTROABS 8.7*  HGB 12.4  HCT 37.9  MCV 99.2  PLT 195   No results found for: TSH No results found for: HGBA1C No results found for: CHOL, HDL, LDLCALC, LDLDIRECT, TRIG, CHOLHDL  Significant Diagnostic Results in last 30 days:  No results found.  Assessment/Plan OAB (overactive bladder) Continue myrbetriq 25 mg qhs  Memory loss Progressive decline in short term memory loss Supportive care only due to goals of care  Dysphagia Denies any issues chewing or swallowing. Continues on a regular diet per pt request (has waiver)  Wrist fracture, closed, right, sequela Healed  Frailty syndrome in geriatric patient Low weight, 84 years old, high fall risk Needs assistance for any activity to prevent falls Does not want to take nutritional supplements    Family/ staff Communication: resident  Labs/tests ordered:  NA

## 2018-04-10 NOTE — Assessment & Plan Note (Signed)
Healed

## 2018-05-05 ENCOUNTER — Non-Acute Institutional Stay (SKILLED_NURSING_FACILITY): Payer: Medicare Other | Admitting: Adult Health

## 2018-05-05 ENCOUNTER — Encounter: Payer: Self-pay | Admitting: Adult Health

## 2018-05-05 DIAGNOSIS — I872 Venous insufficiency (chronic) (peripheral): Secondary | ICD-10-CM

## 2018-05-05 DIAGNOSIS — N3946 Mixed incontinence: Secondary | ICD-10-CM | POA: Diagnosis not present

## 2018-05-05 DIAGNOSIS — N3281 Overactive bladder: Secondary | ICD-10-CM

## 2018-05-05 DIAGNOSIS — R413 Other amnesia: Secondary | ICD-10-CM

## 2018-05-05 DIAGNOSIS — M810 Age-related osteoporosis without current pathological fracture: Secondary | ICD-10-CM

## 2018-05-05 DIAGNOSIS — R269 Unspecified abnormalities of gait and mobility: Secondary | ICD-10-CM

## 2018-05-05 NOTE — Progress Notes (Signed)
Location:  [facility]:  SNF (31) Provider:   Peggye Ley, ANP [facility] [phone]   Kermit Balo, DO  Patient Care Team: Kermit Balo, DO as PCP - General (Geriatric Medicine) Lavonne Chick, [facility]  Extended Emergency Contact Information Primary Emergency Contact: Alcide Evener [place] Home Phone: [phone] Mobile Phone: [phone] Relation: Other Secondary Emergency Contact: Manson Passey [place] Home Phone: [phone] Work Phone: [phone] Relation: Nephew  Code Status:  DNR Goals of care: Advanced Directive information Advanced Directives 03/11/2018  Does Patient Have a Medical Advance Directive? Yes  Type of Estate agent of [place];Out of facility DNR (pink MOST or yellow form)  Does patient want to make changes to medical advance directive? No - Patient declined  Copy of Healthcare Power of Attorney in Chart? Yes  Would patient like information on creating a medical advance directive? -  Pre-existing out of facility DNR order (yellow form or pink MOST form) Yellow form placed in chart (order not valid for inpatient use);Pink MOST form placed in chart (order not valid for inpatient use)     Chief Complaint  Patient presents with  . Acute Visit  . Medical Management of Chronic Issues    HPI:  Pt is a 84 y.o. female seen today for medical management of chronic diseases.    Progressive decline in short term memory loss Nurse reports sleeping more during the day. Continues to do word puzzles to maintain function.  07/04/17 MMSE 25/30 12/29/15 MMSE 28/30  Urinary frequency: improved once myrbetriq started but continues to be an issues. She has to be toileted frequently and needs a schedule per resident request. Denies dysuria, or bladder pain.   Gait abnormality: uses a walker and requires assistance to prevent falls  Hx  of osteoporosis and fractures with fall. Not currently on medication due to age/goals of care  Past Medical History:  Diagnosis Date  . Acquired deformity of chest and rib 05/28/2007  . Chest pain, unspecified 12/02/2001  . Cough 08/24/1998  . Edema 03/25/2006  . Fracture of neck of femur (HCC) 03/19/2006  . Internal hemorrhoids without mention of complication 02/13/1999  . Lumbago 02/19/2007  . Osteoporosis, unspecified 03/25/2006  . Pain in limb 12/02/2001  . Primary localized osteoarthrosis, unspecified site 06/05/2006  . Rectal prolapse 07/10/2006  . Scoliosis (and kyphoscoliosis), idiopathic 05/14/2006  . Seborrhea 10/04/2004  . Torticollis, unspecified 10/04/2004  . Unspecified urinary incontinence 02/28/2000   Past Surgical History:  Procedure Laterality Date  . APPENDECTOMY    . BOTOX INJECTION  12/2005   right neck torticollis/cervical dystonia  . CATARACT EXTRACTION    . COLONOSCOPY  2000  . HEMORRHOID SURGERY      No Known Allergies  Outpatient Encounter Medications as of 05/05/2018  Medication Sig  . acetaminophen (TYLENOL) 325 MG tablet Take 2 tablets (650 mg total) by mouth every 6 (six) hours as needed for moderate pain or headache.  . hydroxypropyl methylcellulose / hypromellose (ISOPTO TEARS / GONIOVISC) 2.5 % ophthalmic solution Place 1 drop into both eyes as needed for dry eyes.  . mirabegron ER (MYRBETRIQ) 25 MG TB24 tablet Take 25 mg by mouth daily.   No facility-administered encounter medications on file as of 05/05/2018.     Review of Systems  Constitutional: Negative for activity change, appetite change, chills, diaphoresis, fatigue, fever and unexpected weight change.  HENT: Negative for congestion.   Eyes:  Progressive vision loss  Respiratory: Negative for cough, shortness of breath and wheezing.   Cardiovascular: Negative for chest pain, palpitations and leg swelling.  Gastrointestinal: Negative for abdominal distention, abdominal pain, constipation and  diarrhea.  Genitourinary: Positive for frequency and urgency. Negative for decreased urine volume, difficulty urinating, dysuria, flank pain, pelvic pain and vaginal bleeding.  Musculoskeletal: Positive for gait problem. Negative for arthralgias, back pain, joint swelling and myalgias.  Neurological: Positive for tremors and weakness. Negative for dizziness, seizures, syncope, facial asymmetry, speech difficulty, light-headedness, numbness and headaches.  Psychiatric/Behavioral: Positive for confusion. Negative for agitation and behavioral problems.    Immunization History  Administered Date(s) Administered  . Influenza Inj Mdck Quad Pf 08/02/2016  . Influenza Whole 08/14/2013  . Influenza-Unspecified 07/29/2014, 08/04/2015, 08/08/2017  . Zoster 10/16/2011   Pertinent  Health Maintenance Due  Topic Date Due  . PNA vac Low Risk Adult (1 of 2 - PCV13) 08/27/1982  . INFLUENZA VACCINE  05/15/2018  . DEXA SCAN  Discontinued   Fall Risk  03/20/2017  Falls in the past year? No   Functional Status Survey:    Vitals:   05/05/18 1056  Weight: 86 lb (39 kg)   Body mass index is 16.8 kg/m. Physical Exam  Constitutional: She is oriented to person, place, and time. No distress.  Very thin and frail  HENT:  Head: Normocephalic and atraumatic.  Mouth/Throat: No oropharyngeal exudate.  Neck: No JVD present.  Submental adenopathy but no tenderness  Cardiovascular: Normal rate and regular rhythm.  Murmur heard. Pulmonary/Chest: Effort normal. No respiratory distress. She has no wheezes.  Scattered rhonchi  Abdominal: Soft. Bowel sounds are normal.  Neurological: She is alert and oriented to person, place, and time.  Oriented but forgetful of the details of her care.   Skin: Skin is warm and dry. She is not diaphoretic.  Psychiatric: She has a normal mood and affect.    Labs reviewed: Recent Labs    05/28/17 0848  NA 140  K 3.3*  CL 99*  CO2 29  GLUCOSE 121*  BUN 21*    CREATININE 0.72  CALCIUM 8.9   No results for input(s): AST, ALT, ALKPHOS, BILITOT, PROT, ALBUMIN in the last 8760 hours. Recent Labs    05/28/17 0848  WBC 11.1*  NEUTROABS 8.7*  HGB 12.4  HCT 37.9  MCV 99.2  PLT 195   No results found for: TSH No results found for: HGBA1C No results found for: CHOL, HDL, LDLCALC, LDLDIRECT, TRIG, CHOLHDL  Significant Diagnostic Results in last 30 days:  No results found.  Assessment/Plan  OAB (overactive bladder) Continue myrbetriq. Staff to schedule toileting q 2 hrs.   Gait abnormality Continue walker and assistance from staff to prevent falls.   Memory loss Progressive decline in memory and function. She verbalizes regularly that she is ready to pass away and does not want any medical interventions.   Urinary incontinence, mixed Intermittent problem. See above  Osteoporosis No additional treatment due to age/goals of care.    Family/ staff Communication: resident/staff  Labs/tests ordered:  NA

## 2018-05-07 DIAGNOSIS — M810 Age-related osteoporosis without current pathological fracture: Secondary | ICD-10-CM | POA: Insufficient documentation

## 2018-05-07 NOTE — Assessment & Plan Note (Signed)
Continue walker and assistance from staff to prevent falls.

## 2018-05-07 NOTE — Assessment & Plan Note (Signed)
Intermittent problem. See above

## 2018-05-07 NOTE — Assessment & Plan Note (Signed)
Progressive decline in memory and function. She verbalizes regularly that she is ready to pass away and does not want any medical interventions.

## 2018-05-07 NOTE — Assessment & Plan Note (Signed)
Continue myrbetriq. Staff to schedule toileting q 2 hrs.

## 2018-05-07 NOTE — Assessment & Plan Note (Signed)
No additional treatment due to age/goals of care.

## 2018-05-26 ENCOUNTER — Encounter: Payer: Self-pay | Admitting: Adult Health

## 2018-05-26 ENCOUNTER — Non-Acute Institutional Stay (SKILLED_NURSING_FACILITY): Payer: Medicare Other | Admitting: Adult Health

## 2018-05-26 DIAGNOSIS — K625 Hemorrhage of anus and rectum: Secondary | ICD-10-CM

## 2018-05-26 DIAGNOSIS — R1032 Left lower quadrant pain: Secondary | ICD-10-CM | POA: Diagnosis not present

## 2018-05-26 DIAGNOSIS — Z7189 Other specified counseling: Secondary | ICD-10-CM | POA: Diagnosis not present

## 2018-05-26 NOTE — Progress Notes (Addendum)
[facility]   Place of Service:  SNF (31) Provider:   Peggye Leyhristy Nicanor [place], ANP [facility] [phone]   Kermit Baloeed, [name] L, DO  Patient Care Team: Kermit Baloeed, [name] L, DO as PCP - General (Geriatric Medicine) [facility]ommunity, [facility]  Extended Emergency Contact Information Primary Emergency Contact: Alcide EvenerSomers,[name]  [place] Home Phone: [phone] Mobile Phone: [phone] Relation: Other Secondary Emergency Contact: Manson PasseyBerry,M.Douglas  [place] Home Phone: [phone] Work Phone: [phone] Relation: Nephew  Code Status:  DNR Goals of care: Advanced Directive information Advanced Directives 03/11/2018  Does Patient Have a Medical Advance Directive? Yes  Type of Estate agentAdvance Directive Healthcare Power of [place];Out of facility DNR (pink MOST or yellow form)  Does patient want to make changes to medical advance directive? No - Patient declined  Copy of Healthcare Power of Attorney in Chart? Yes  Would patient like information on creating a medical advance directive? -  Pre-existing out of facility DNR order (yellow form or pink MOST form) Yellow form placed in chart (order not valid for inpatient use);Pink MOST form placed in chart (order not valid for inpatient use)     Chief Complaint  Patient presents with  . Acute Visit    loose stools, abd pain, htn    HPI:  Pt is a [age] y.o. female seen today for an acute visit for loose stools with blood, abd pain, htn, and low grade temp (99). The nurse reports that she had a low grade temp and a few loose stools over the weekend. This morning she abruptly started shaking all over and had a BP of 228/124, HR of 121, abd pain and grunting sounds when she would breath. When I came in the room she had had a BM which was soft with no blood. She reported abd pain but could not elaborate further. No nausea or vomiting. She has a most form indicating comfort care measure  only. She has been desiring to pass away for sometime now. Of note she fell on 8/10 and was found on the floor. She has been monitored with neuro checks and vital signs. No injuries noted.    Past Medical History:  Diagnosis Date  . Acquired deformity of chest and rib 05/28/2007  . Chest pain, unspecified 12/02/2001  . Cough 08/24/1998  . Edema 03/25/2006  . Fracture of neck of femur (HCC) 03/19/2006  . Internal hemorrhoids without mention of complication 02/13/1999  . Lumbago 02/19/2007  . Osteoporosis, unspecified 03/25/2006  . Pain in limb 12/02/2001  . Primary localized osteoarthrosis, unspecified site 06/05/2006  . Rectal prolapse 07/10/2006  . Scoliosis (and kyphoscoliosis), idiopathic 05/14/2006  . Seborrhea 10/04/2004  . Torticollis, unspecified 10/04/2004  . Unspecified urinary incontinence 02/28/2000   Past Surgical History:  Procedure Laterality Date  . APPENDECTOMY    . BOTOX INJECTION  12/2005   right neck torticollis/cervical dystonia  . CATARACT EXTRACTION    . COLONOSCOPY  2000  . HEMORRHOID SURGERY      No Known Allergies  Outpatient Encounter Medications as of 05/26/2018  Medication Sig  . acetaminophen (TYLENOL) 325 MG tablet Take 2 tablets (650 mg total) by mouth every 6 (six) hours as needed for moderate pain or headache.  . hydroxypropyl methylcellulose / hypromellose (ISOPTO TEARS / GONIOVISC) 2.5 % ophthalmic solution Place 1 drop into both eyes as needed for dry eyes.  . mirabegron ER (MYRBETRIQ) 25 MG TB24 tablet Take 25 mg by mouth daily.   No facility-administered  encounter medications on file as of 05/26/2018.     Review of Systems  Constitutional: Positive for activity change, appetite change, chills, diaphoresis, fatigue and fever.  HENT: Negative for congestion.   Eyes: Negative for visual disturbance.  Respiratory: Negative for cough, shortness of breath and wheezing.   Cardiovascular: Negative for chest pain, palpitations and leg swelling.    Gastrointestinal: Positive for abdominal pain, blood in stool and diarrhea. Negative for abdominal distention, constipation, nausea, rectal pain and vomiting.  Endocrine: Negative for polyphagia and polyuria.  Genitourinary: Negative for decreased urine volume, difficulty urinating, dysuria, flank pain and frequency.  Musculoskeletal: Positive for gait problem. Negative for arthralgias and back pain.  Skin: Negative for rash.  Neurological: Positive for tremors and weakness. Negative for dizziness, seizures, syncope, facial asymmetry, speech difficulty, light-headedness, numbness and headaches.  Psychiatric/Behavioral: Positive for confusion.    Immunization History  Administered Date(s) Administered  . Influenza Inj Mdck Quad Pf 08/02/2016  . Influenza Whole 08/14/2013  . Influenza-Unspecified 07/29/2014, 08/04/2015, 08/08/2017  . Zoster 10/16/2011   Pertinent  Health Maintenance Due  Topic Date Due  . PNA vac Low Risk Adult (1 of 2 - PCV13) 08/27/1982  . INFLUENZA VACCINE  05/15/2018  . DEXA SCAN  Discontinued   Fall Risk  03/20/2017  Falls in the past year? No   Functional Status Survey:    Vitals:   05/26/18 1239  BP: (!) 228/124  Pulse: (!) 121  Resp: (!) 24  Temp: 98.4 F (36.9 C)   There is no height or weight on file to calculate BMI. Physical Exam  Constitutional: No distress.  Frail thin female  HENT:  Head: Normocephalic and atraumatic.  Nose: Nose normal.  Mouth/Throat: Oropharynx is clear and moist.  Eyes: Pupils are equal, round, and reactive to light. Conjunctivae are normal. Right eye exhibits no discharge. Left eye exhibits no discharge.  Neck: No JVD present. No tracheal deviation present. No thyromegaly present.  Adenopathy of submental nodes without redness or tenderness  Cardiovascular: Regular rhythm and normal heart sounds.  Elevated 121  Pulmonary/Chest: Breath sounds normal. She is in respiratory distress (slight increased wob with grunting).   Abdominal: Soft. Bowel sounds are normal. She exhibits mass (LLQ large protrusion ). She exhibits no distension. There is tenderness (LLQ).  Neurological: She is alert.  Able to f/c, oriented to self and situation  Skin: Skin is warm and dry. She is not diaphoretic. There is pallor.    Labs reviewed: Recent Labs    05/28/17 0848  NA 140  K 3.3*  CL 99*  CO2 29  GLUCOSE 121*  BUN 21*  CREATININE 0.72  CALCIUM 8.9   No results for input(s): AST, ALT, ALKPHOS, BILITOT, PROT, ALBUMIN in the last 8760 hours. Recent Labs    05/28/17 0848  WBC 11.1*  NEUTROABS 8.7*  HGB 12.4  HCT 37.9  MCV 99.2  PLT 195   No results found for: TSH No results found for: HGBA1C No results found for: CHOL, HDL, LDLCALC, LDLDIRECT, TRIG, CHOLHDL  Significant Diagnostic Results in last 30 days:  No results found.  Assessment/Plan  1. Left lower quadrant pain With protruding mass noted to the LLQ She may have a volvulus, bowel perforation, hernia strangulation etc She and her family do not want treatment for this condition, see below. She appears to be transitioning to the dying process. Will discontinue all oral medications except Roxanol 5 mg q 2 hrs prn and ativan 0.25 mg q 2 hrs prn.  Avoid all foods, may have sips of water for comfort.   2. Rectal bleeding Not present during my visit but did occur yesterday, as above  3. Advanced care planning/counseling discussion 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 I called her POA Ms. Somers. We agreed not to send her to the hospital. We will honor her wishes to be kept comfortable and pass away at peace.  She has an acute process going on in her abd but does not want treatment for this condition. She has roxanol and ativan ordered for comfort. The nurse administer to the first dose and her BP came down and she appeared much more comfortable. Her family will be in shortly to help provide support.   30 minutes spent in advanced care  planning for end of life care  Family/ staff Communication: discussed with POA Ms. Somers  Labs/tests ordered: NA

## 2018-05-26 NOTE — ACP (Advance Care Planning) (Addendum)
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 I called her POA Ms. Somers. We agreed not to send her to the hospital. We will honor her wishes to be kept comfortable and pass away at peace.  She has an acute process going on in her abd but does not want treatment for this condition. See encounter 05/26/18. She has roxanol and ativan ordered for comfort. The nurse administer to the first dose and her BP came down and she appeared much more comfortable. Her family will be in shortly to help provide support.

## 2018-05-27 ENCOUNTER — Non-Acute Institutional Stay (SKILLED_NURSING_FACILITY): Payer: Medicare Other | Admitting: Internal Medicine

## 2018-05-27 ENCOUNTER — Encounter: Payer: Self-pay | Admitting: Internal Medicine

## 2018-05-27 DIAGNOSIS — K460 Unspecified abdominal hernia with obstruction, without gangrene: Secondary | ICD-10-CM | POA: Diagnosis not present

## 2018-05-27 DIAGNOSIS — K625 Hemorrhage of anus and rectum: Secondary | ICD-10-CM | POA: Diagnosis not present

## 2018-05-27 DIAGNOSIS — R1032 Left lower quadrant pain: Secondary | ICD-10-CM | POA: Diagnosis not present

## 2018-05-27 DIAGNOSIS — Z515 Encounter for palliative care: Secondary | ICD-10-CM

## 2018-06-15 NOTE — Progress Notes (Signed)
Patient ID: [name], female   DOB: May 20, 1915, 84 y.o.   MRN: [id number]  Location:  [facility] Room Number: 680 Place of Service:  SNF [phone]) Provider:   Gayland Curry, DO  Patient Care Team: Gayland Curry, DO as PCP - General (Geriatric Medicine) [facility]  Extended Emergency Contact Information Primary Emergency Contact: [place] of [place] Phone: [phone] Mobile Phone: [phone] Relation: Other Secondary Emergency Contact: [place] of [place] Phone: [phone] Work Phone: [phone] Relation: Nephew  Code Status:  DNR, MOST, comfort care Goals of care: Advanced Directive information Advanced Directives Jun 23, 2018  Does Patient Have a Medical Advance Directive? Yes  Type of Paramedic of [place];Out of facility DNR (pink MOST or yellow form)  Does patient want to make changes to medical advance directive? No - Patient declined  Copy of [place] in Chart? Yes  Would patient like information on creating a medical advance directive? -  Pre-existing out of facility DNR order (yellow form or pink MOST form) Yellow form placed in chart (order not valid for inpatient use);Pink MOST form placed in chart (order not valid for inpatient use)   Chief Complaint  Patient presents with  . Acute Visit    end of life care    HPI:  Pt is a 84 y.o. female seen today for an acute visit for end of life care.  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  She has a very tender prominent LLQ mass the size of a grapefruit, is very pale and now unresponsive.  NP has met with her family who have accepted pt's wishes to be treated for pain and to be allowed to die peacefully at age 53.    Her niece requested to see me for me to make sure she was getting her pain medications as  needed at all hours.  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  She requested her medication be scheduled to ensure she did not suffer in any way.  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    Past Medical History:  Diagnosis Date  . Acquired deformity of chest and rib 05/28/2007  . Chest pain, unspecified 12/02/2001  . Cough 08/24/1998  . Edema 03/25/2006  . Fracture of neck of femur ([place]) 03/19/2006  . Internal hemorrhoids without mention of complication 02/17/9793  . Lumbago 02/19/2007  . Osteoporosis, unspecified 03/25/2006  . Pain in limb 12/02/2001  . Primary localized osteoarthrosis, unspecified site 06/05/2006  . Rectal prolapse 07/10/2006  . Scoliosis (and kyphoscoliosis), idiopathic 05/14/2006  . Seborrhea 10/04/2004  . Torticollis, unspecified 10/04/2004  . Unspecified urinary incontinence 02/28/2000   Past Surgical History:  Procedure Laterality Date  . APPENDECTOMY    . BOTOX INJECTION  12/2005   right neck torticollis/cervical dystonia  . CATARACT EXTRACTION    . COLONOSCOPY  2000  . HEMORRHOID SURGERY      No Known Allergies  Outpatient Encounter Medications as of 23-Jun-2018  Medication Sig  . LORazepam (ATIVAN) 2 MG/ML concentrated solution Take 0.25 mg by mouth every 2 (two) hours.  Marland Kitchen morphine 10 MG/5ML solution Take 0.5 mg by mouth every 2 (two) hours.  . polyvinyl alcohol (LIQUIFILM TEARS) 1.4 % ophthalmic solution Place 1 drop into both eyes as needed for  dry eyes.  . [DISCONTINUED] acetaminophen (TYLENOL) 325 MG tablet Take 2 tablets (650 mg total) by mouth every 6 (six) hours as needed for moderate pain or headache.  . [DISCONTINUED] hydroxypropyl methylcellulose / hypromellose (ISOPTO TEARS / GONIOVISC) 2.5 % ophthalmic solution Place 1 drop into both eyes as needed for dry eyes.  . [DISCONTINUED] mirabegron ER (MYRBETRIQ) 25 MG TB24 tablet Take 25 mg by  mouth daily.   No facility-administered encounter medications on file as of 2018-06-26.     Review of Systems  Unable to perform ROS: Patient unresponsive    Immunization History  Administered Date(s) Administered  . Influenza Inj Mdck Quad Pf 08/02/2016  . Influenza Whole 08/14/2013  . Influenza-Unspecified 07/29/2014, 08/04/2015, 08/08/2017  . Zoster 10/16/2011   Pertinent  Health Maintenance Due  Topic Date Due  . PNA vac Low Risk Adult (1 of 2 - PCV13) 08/27/1982  . INFLUENZA VACCINE  05/15/2018  . DEXA SCAN  Discontinued   Fall Risk  03/20/2017  Falls in the past year? No   Functional Status Survey:    Vitals:   06-26-2018 1558  BP: 125/71  Pulse: (!) 106  Resp: (!) 26  Temp: 100.1 F (37.8 C)  TempSrc: Oral  SpO2: 91%  Weight: 84 lb (38.1 kg)  Height: 5' (1.524 m)   Body mass index is 16.41 kg/m. Physical Exam  Constitutional: No distress.  Thin white female resting in bed, now unresponsive; very warm to touch  Cardiovascular: Normal rate, regular rhythm and normal heart sounds.  Pulmonary/Chest: Effort normal and breath sounds normal. No respiratory distress.  Had a small apneic period of just a few seconds during my visit  Abdominal: Soft. She exhibits mass. She exhibits no distension. There is tenderness. There is no rebound and no guarding. A hernia is present.  Hypoactive bowel sounds  Skin: There is pallor.    Labs reviewed: Recent Labs    05/28/17 0848  NA 140  K 3.3*  CL 99*  CO2 29  GLUCOSE 121*  BUN 21*  CREATININE 0.72  CALCIUM 8.9   No results for input(s): AST, ALT, ALKPHOS, BILITOT, PROT, ALBUMIN in the last 8760 hours. Recent Labs    05/28/17 0848  WBC 11.1*  NEUTROABS 8.7*  HGB 12.4  HCT 37.9  MCV 99.2  PLT 195   Assessment/Plan 1. Left lower quadrant pain 2. Rectal bleeding 1and 2 due to #3 with rupture I suspect 3. Hernia with obstruction No imaging evaluation desired, but pure comfort care  4. End of life  care Will schedule her roxanol '5mg'$  q 2hrs at this point and continue the ativan q 2 hrs as well We discussed death rattle/increased secretions and I also ordered some sponges for moistening her mouth as needed  Resident appeared quite comfortable when seen and was just beginning to have periods of apnea  Family/ staff Communication: met with niece, Lelon Frohlich and visiting friend in the room; also discussed with SNF nurse, nurse managers  Labs/tests ordered:  None  Johnwilliam Shepperson L. Hersey Maclellan, D.O. [facility] [address], [place] [address] Cell Phone (Mon-Fri 8am-5pm):  [phone] On Call:  [phone] & follow prompts after 5pm & weekends Office Phone:  [phone] Office Fax:  [phone]

## 2018-06-15 DEATH — deceased
